# Patient Record
Sex: Female | Born: 1985 | Race: White | Hispanic: No | Marital: Single | Smoking: Never smoker
Health system: Southern US, Community
[De-identification: ages and names within clinical notes are randomized; demographics above are authoritative.]

## PROBLEM LIST (undated history)

## (undated) DIAGNOSIS — F419 Anxiety disorder, unspecified: Secondary | ICD-10-CM

## (undated) DIAGNOSIS — E66811 Obesity, class 1: Secondary | ICD-10-CM

## (undated) DIAGNOSIS — E669 Obesity, unspecified: Secondary | ICD-10-CM

## (undated) DIAGNOSIS — O24419 Gestational diabetes mellitus in pregnancy, unspecified control: Secondary | ICD-10-CM

## (undated) DIAGNOSIS — N12 Tubulo-interstitial nephritis, not specified as acute or chronic: Secondary | ICD-10-CM

## (undated) DIAGNOSIS — I10 Essential (primary) hypertension: Secondary | ICD-10-CM

## (undated) HISTORY — DX: Gestational diabetes mellitus in pregnancy, unspecified control: O24.419

## (undated) HISTORY — DX: Obesity, unspecified: E66.9

## (undated) HISTORY — DX: Obesity, class 1: E66.811

## (undated) HISTORY — DX: Tubulo-interstitial nephritis, not specified as acute or chronic: N12

---

## 2004-03-31 ENCOUNTER — Ambulatory Visit: Payer: Self-pay

## 2004-06-16 ENCOUNTER — Observation Stay: Payer: Self-pay | Admitting: Unknown Physician Specialty

## 2004-06-16 ENCOUNTER — Observation Stay: Payer: Self-pay

## 2004-06-17 ENCOUNTER — Inpatient Hospital Stay: Payer: Self-pay

## 2005-03-12 ENCOUNTER — Emergency Department: Payer: Self-pay | Admitting: Unknown Physician Specialty

## 2005-06-22 ENCOUNTER — Emergency Department: Payer: Self-pay | Admitting: Emergency Medicine

## 2005-12-22 ENCOUNTER — Emergency Department: Payer: Self-pay | Admitting: Emergency Medicine

## 2008-01-08 ENCOUNTER — Observation Stay: Payer: Self-pay

## 2008-01-08 ENCOUNTER — Other Ambulatory Visit: Payer: Self-pay

## 2008-01-10 ENCOUNTER — Ambulatory Visit: Payer: Self-pay | Admitting: Certified Nurse Midwife

## 2008-01-12 ENCOUNTER — Inpatient Hospital Stay: Payer: Self-pay

## 2008-08-28 ENCOUNTER — Emergency Department: Payer: Self-pay | Admitting: Emergency Medicine

## 2010-02-20 ENCOUNTER — Emergency Department: Payer: Self-pay | Admitting: Internal Medicine

## 2010-12-22 ENCOUNTER — Emergency Department: Payer: Self-pay | Admitting: Unknown Physician Specialty

## 2010-12-24 ENCOUNTER — Emergency Department: Payer: Self-pay | Admitting: Emergency Medicine

## 2011-01-07 ENCOUNTER — Emergency Department: Payer: Self-pay | Admitting: Unknown Physician Specialty

## 2015-11-13 ENCOUNTER — Emergency Department
Admission: EM | Admit: 2015-11-13 | Discharge: 2015-11-13 | Disposition: A | Discharge status: Home / Self Care | Payer: 59 | Attending: Emergency Medicine | Admitting: Emergency Medicine

## 2015-11-13 ENCOUNTER — Encounter: Payer: Self-pay | Admitting: *Deleted

## 2015-11-13 ENCOUNTER — Emergency Department: Payer: 59

## 2015-11-13 DIAGNOSIS — M545 Low back pain, unspecified: Secondary | ICD-10-CM

## 2015-11-13 DIAGNOSIS — I10 Essential (primary) hypertension: Secondary | ICD-10-CM | POA: Diagnosis not present

## 2015-11-13 HISTORY — DX: Essential (primary) hypertension: I10

## 2015-11-13 LAB — URINALYSIS COMPLETE WITH MICROSCOPIC (ARMC ONLY)
BILIRUBIN URINE: NEGATIVE
GLUCOSE, UA: NEGATIVE mg/dL
HGB URINE DIPSTICK: NEGATIVE
Ketones, ur: NEGATIVE mg/dL
Nitrite: NEGATIVE
Protein, ur: NEGATIVE mg/dL
Specific Gravity, Urine: 1.018 (ref 1.005–1.030)
pH: 7 (ref 5.0–8.0)

## 2015-11-13 LAB — POCT PREGNANCY, URINE: Preg Test, Ur: NEGATIVE

## 2015-11-13 MED ORDER — METHOCARBAMOL 500 MG PO TABS: 500.0000 mg | ORAL_TABLET | Freq: Four times a day (QID) | ORAL | Status: DC

## 2015-11-13 MED ORDER — HYDROCODONE-ACETAMINOPHEN 5-325 MG PO TABS: 1.0000 | ORAL_TABLET | ORAL | Status: DC | PRN

## 2015-11-13 NOTE — ED Notes (Signed)
Patient c/o low, left-side back pain that began two days ago and has worsened. Patient states she couldn't stand upright after getting out of bed today.

## 2015-11-13 NOTE — ED Provider Notes (Signed)
[facility] Emergency Department Provider Note   ____________________________________________  Time seen: Approximately 12:44 PM  I have reviewed the triage vital signs and the nursing notes.   HISTORY  Chief Complaint Back Pain    HPI Lindsey Gutierrez is a 30 y.o. female is here complaint of left sided low back pain that began 2 days ago and has become worse gradually over the course of today. Patient states she could not stand up right after getting out of bed today. She has taken some ibuprofen without any relief. She denies any urinary symptoms or history of kidney stones. She denies any injury to her back. She states she's had similar symptoms in the past and has never had her back x-rayed. She denies any paresthesias, or loss of bowel or bladder control.She rates her pain as a 6 out of 10.   Past Medical History  Diagnosis Date  . Hypertension     There are no active problems to display for this patient.   History reviewed. No pertinent past surgical history.  Current Outpatient Rx  Name  Route  Sig  Dispense  Refill  . HYDROcodone-acetaminophen (NORCO/VICODIN) 5-325 MG tablet   Oral   Take 1 tablet by mouth every 4 (four) hours as needed for moderate pain.   20 tablet   0   . methocarbamol (ROBAXIN) 500 MG tablet   Oral   Take 1 tablet (500 mg total) by mouth 4 (four) times daily.   20 tablet   0     Allergies Aspirin  No family history on file.  Social History Social History  Substance Use Topics  . Smoking status: Never Smoker   . Smokeless tobacco: None  . Alcohol Use: No    Review of Systems Constitutional: No fever/chills Cardiovascular: Denies chest pain. Respiratory: Denies shortness of breath. Gastrointestinal: No abdominal pain.  No nausea, no vomiting.  No diarrhea.   Genitourinary: Negative for dysuria. Musculoskeletal: Positive for low back pain. Skin: Negative for rash. Neurological: Negative for  headaches, focal weakness or numbness.  10-point ROS otherwise negative.  ____________________________________________   PHYSICAL EXAM:  VITAL SIGNS: ED Triage Vitals  Enc Vitals Group     BP 11/13/15 1236 127/79 mmHg     Pulse Rate 11/13/15 1236 80     Resp 11/13/15 1236 18     Temp 11/13/15 1236 98.3 F (36.8 C)     Temp Source 11/13/15 1236 Oral     SpO2 11/13/15 1236 96 %     Weight 11/13/15 1236 165 lb (74.844 kg)     Height 11/13/15 1236 5\' 4"  (1.626 m)     Head Cir --      Peak Flow --      Pain Score 11/13/15 1238 6     Pain Loc --      Pain Edu? --      Excl. in GC? --     Constitutional: Alert and oriented. Well appearing and in no acute distress. Eyes: Conjunctivae are normal. PERRL. EOMI. Head: Atraumatic. Nose: No congestion/rhinnorhea. Neck: No stridor.   Cardiovascular: Normal rate, regular rhythm. Grossly normal heart sounds.  Good peripheral circulation. Respiratory: Normal respiratory effort.  No retractions. Lungs CTAB. Gastrointestinal: Soft and nontender. No distention. No abdominal bruits. No CVA tenderness. Musculoskeletal: On examination of the back there is no gross deformity. There is moderate tenderness on palpation of the left paravertebral muscle area at L5-S1 and sacral area. There is no active muscle  spasm seen but patient's range of motion is restricted secondary to discomfort. Straight leg raises were 90 with discomfort on the left. Neurologic:  Normal speech and language. No gross focal neurologic deficits are appreciated. Reflexes are 2+ bilaterally. Skin:  Skin is warm, dry and intact. No rash noted. Psychiatric: Mood and affect are normal. Speech and behavior are normal.  ____________________________________________   LABS (all labs ordered are listed, but only abnormal results are displayed)  Labs Reviewed  URINALYSIS COMPLETEWITH MICROSCOPIC ([facility] ONLY) - Abnormal; Notable for the following:    Color, Urine YELLOW (*)     APPearance CLOUDY (*)    Leukocytes, UA 3+ (*)    Bacteria, UA RARE (*)    Squamous Epithelial / LPF 6-30 (*)    All other components within normal limits  POC URINE PREG, ED  POCT PREGNANCY, URINE     RADIOLOGY X-ray lumbar spine per radiologist is normal.  ____________________________________________   PROCEDURES  Procedure(s) performed: None  Critical Care performed: No  ____________________________________________   INITIAL IMPRESSION / ASSESSMENT AND PLAN / ED COURSE  Pertinent labs & imaging results that were available during my care of the patient were reviewed by me and considered in my medical decision making (see chart for details).  Discussed probable muscle strain with patient. Patient was given a prescription for Norco and Robaxin to be taken at home. She is also use ice or heat to her back and follow-up with her doctor as [facility] if any continued problems. ____________________________________________   FINAL CLINICAL IMPRESSION(S) / ED DIAGNOSES  Final diagnoses:  Left-sided low back pain without sciatica      NEW MEDICATIONS STARTED DURING THIS VISIT:  New Prescriptions   HYDROCODONE-ACETAMINOPHEN (NORCO/VICODIN) 5-325 MG TABLET    Take 1 tablet by mouth every 4 (four) hours as needed for moderate pain.   METHOCARBAMOL (ROBAXIN) 500 MG TABLET    Take 1 tablet (500 mg total) by mouth 4 (four) times daily.     Note:  This document was prepared using Dragon voice recognition software and may include unintentional dictation errors.    Tommi Rumps, PA-C 11/13/15 1452  Arnaldo Natal, MD 11/13/15 [phone]

## 2015-11-13 NOTE — Discharge Instructions (Signed)
Follow-up with your doctor next week if any continued problems. Use ice or heat to your back for comfort. Begin taking medication as directed. The aware that both medications could cause stress tenderness and do not drive while taking them.

## 2015-11-13 NOTE — ED Notes (Signed)
Pt presents with lower back pain since Wednesday. Denies injury or trauma. States that the pain was so bad this morning she was unable to walk to the bathroom and she had to crawl. NAD noted.

## 2015-11-13 NOTE — ED Notes (Signed)
Pt discharged home after verbalizing understanding of discharge instructions; nad noted. 

## 2017-06-22 ENCOUNTER — Emergency Department
Admission: EM | Admit: 2017-06-22 | Discharge: 2017-06-22 | Disposition: A | Discharge status: Home / Self Care | Payer: 59 | Attending: Emergency Medicine | Admitting: Emergency Medicine

## 2017-06-22 ENCOUNTER — Encounter: Payer: Self-pay | Admitting: Emergency Medicine

## 2017-06-22 ENCOUNTER — Other Ambulatory Visit: Payer: Self-pay

## 2017-06-22 DIAGNOSIS — Z79899 Other long term (current) drug therapy: Secondary | ICD-10-CM | POA: Insufficient documentation

## 2017-06-22 DIAGNOSIS — I10 Essential (primary) hypertension: Secondary | ICD-10-CM | POA: Insufficient documentation

## 2017-06-22 DIAGNOSIS — N3001 Acute cystitis with hematuria: Secondary | ICD-10-CM | POA: Insufficient documentation

## 2017-06-22 DIAGNOSIS — N3000 Acute cystitis without hematuria: Secondary | ICD-10-CM

## 2017-06-22 LAB — URINALYSIS, COMPLETE (UACMP) WITH MICROSCOPIC
Bacteria, UA: NONE SEEN
Bilirubin Urine: NEGATIVE
Glucose, UA: NEGATIVE mg/dL
Hgb urine dipstick: NEGATIVE
KETONES UR: 5 mg/dL — AB
Nitrite: NEGATIVE
PROTEIN: 100 mg/dL — AB
Specific Gravity, Urine: 1.03 (ref 1.005–1.030)
pH: 7 (ref 5.0–8.0)

## 2017-06-22 LAB — POCT PREGNANCY, URINE: Preg Test, Ur: NEGATIVE

## 2017-06-22 MED ORDER — NAPROXEN 500 MG PO TABS: 500.0000 mg | ORAL_TABLET | Freq: Two times a day (BID) | ORAL | Status: DC

## 2017-06-22 MED ORDER — TRAMADOL HCL 50 MG PO TABS: 50.0000 mg | ORAL_TABLET | Freq: Four times a day (QID) | ORAL | 0 refills | Status: DC | PRN

## 2017-06-22 MED ORDER — SULFAMETHOXAZOLE-TRIMETHOPRIM 800-160 MG PO TABS: 1.0000 | ORAL_TABLET | Freq: Two times a day (BID) | ORAL | 0 refills | Status: DC

## 2017-06-22 MED ORDER — PHENAZOPYRIDINE HCL 200 MG PO TABS: 200.0000 mg | ORAL_TABLET | Freq: Three times a day (TID) | ORAL | 0 refills | Status: DC | PRN

## 2017-06-22 NOTE — ED Provider Notes (Signed)
[facility] Emergency Department Provider Note   ____________________________________________   First MD Initiated Contact with Patient 06/22/17 1425     (approximate)  I have reviewed the triage vital signs and the nursing notes.   HISTORY  Chief Complaint Back Pain    HPI Lindsey Gutierrez is a 31 y.o. female patient presents with low back pain which started yesterday. Patient also complaining of urinary frequency and urgency. Patient denies fever, dysuria, or vaginal discharge.   Past Medical History:  Diagnosis Date  . Hypertension     There are no active problems to display for this patient.   History reviewed. No pertinent surgical history.  Prior to Admission medications   Medication Sig Start Date End Date Taking? Authorizing Provider  HYDROcodone-acetaminophen (NORCO/VICODIN) 5-325 MG tablet Take 1 tablet by mouth every 4 (four) hours as needed for moderate pain. 11/13/15   Tommi RumpsSummers, Rhonda L, PA-C  methocarbamol (ROBAXIN) 500 MG tablet Take 1 tablet (500 mg total) by mouth 4 (four) times daily. 11/13/15   Tommi RumpsSummers, Rhonda L, PA-C  naproxen (NAPROSYN) 500 MG tablet Take 1 tablet (500 mg total) by mouth 2 (two) times daily with a meal. 06/22/17   Joni ReiningSmith, Ronald K, PA-C  phenazopyridine (PYRIDIUM) 200 MG tablet Take 1 tablet (200 mg total) by mouth 3 (three) times daily as needed for pain. 06/22/17   Joni ReiningSmith, Ronald K, PA-C  sulfamethoxazole-trimethoprim (BACTRIM DS,SEPTRA DS) 800-160 MG tablet Take 1 tablet by mouth 2 (two) times daily. 06/22/17   Joni ReiningSmith, Ronald K, PA-C  traMADol (ULTRAM) 50 MG tablet Take 1 tablet (50 mg total) by mouth every 6 (six) hours as needed for moderate pain. 06/22/17   Joni ReiningSmith, Ronald K, PA-C    Allergies Aspirin  No family history on file.  Social History Social History   Tobacco Use  . Smoking status: Never Smoker  . Smokeless tobacco: Never Used  Substance Use Topics  . Alcohol use: No  . Drug use: Not on file     Review of Systems Constitutional: No fever/chills Eyes: No visual changes. ENT: No sore throat. Cardiovascular: Denies chest pain. Respiratory: Denies shortness of breath. Gastrointestinal: No abdominal pain.  No nausea, no vomiting.  No diarrhea.  No constipation. Genitourinary: Negative for dysuria. Musculoskeletal: Left flank pain  Skin: Negative for rash. Neurological: Negative for headaches, focal weakness or numbness. Allergic/Immunilogical: Aspirin ____________________________________________   PHYSICAL EXAM:  VITAL SIGNS: ED Triage Vitals  Enc Vitals Group     BP 06/22/17 1418 (!) 142/77     Pulse Rate 06/22/17 1418 64     Resp 06/22/17 1418 18     Temp 06/22/17 1418 97.9 F (36.6 C)     Temp Source 06/22/17 1418 Oral     SpO2 06/22/17 1418 99 %     Weight 06/22/17 1419 175 lb (79.4 kg)     Height 06/22/17 1419 5\' 5"  (1.651 m)     Head Circumference --      Peak Flow --      Pain Score --      Pain Loc --      Pain Edu? --      Excl. in GC? --     Constitutional: Alert and oriented. Well appearing and in no acute distress. Cardiovascular: Normal rate, regular rhythm. Grossly normal heart sounds.  Good peripheral circulation. Respiratory: Normal respiratory effort.  No retractions. Lungs CTAB. Gastrointestinal: Soft and nontender. No distention. No abdominal bruits. No CVA tenderness. Neurologic:  Normal  speech and language. No gross focal neurologic deficits are appreciated. No gait instability. Skin:  Skin is warm, dry and intact. No rash noted. Psychiatric: Mood and affect are normal. Speech and behavior are normal.  ____________________________________________   LABS (all labs ordered are listed, but only abnormal results are displayed)  Labs Reviewed  URINALYSIS, COMPLETE (UACMP) WITH MICROSCOPIC - Abnormal; Notable for the following components:      Result Value   Color, Urine YELLOW (*)    APPearance CLOUDY (*)    Ketones, ur 5 (*)     Protein, ur 100 (*)    Leukocytes, UA SMALL (*)    Squamous Epithelial / LPF 6-30 (*)    All other components within normal limits  POC URINE PREG, ED  POCT PREGNANCY, URINE   ____________________________________________  EKG   ____________________________________________  RADIOLOGY  No results found.  ____________________________________________   PROCEDURES  Procedure(s) performed: None  Procedures  Critical Care performed: No  ____________________________________________   INITIAL IMPRESSION / ASSESSMENT AND PLAN / ED COURSE  As part of my medical decision making, I reviewed the following data within the electronic MEDICAL RECORD NUMBER    Left flank pain secondary to urinary tract infection. Scars level results with patient. Patient given discharge Instructions advised take medication as directed. [facility]    ____________________________________________   FINAL CLINICAL IMPRESSION(S) / ED DIAGNOSES  Final diagnoses:  Acute cystitis without hematuria     ED Discharge Orders        Ordered    sulfamethoxazole-trimethoprim (BACTRIM DS,SEPTRA DS) 800-160 MG tablet  2 times daily     06/22/17 1526    naproxen (NAPROSYN) 500 MG tablet  2 times daily with meals     06/22/17 1526    traMADol (ULTRAM) 50 MG tablet  Every 6 hours PRN     06/22/17 1526    phenazopyridine (PYRIDIUM) 200 MG tablet  3 times daily PRN     06/22/17 1526       Note:  This document was prepared using Dragon voice recognition software and may include unintentional dictation errors.    Joni Reining, PA-C 06/22/17 1535    Jene Every, MD 06/22/17 [phone]

## 2017-06-22 NOTE — ED Triage Notes (Addendum)
Presents with  lower back pain since yesterday    Ambulates well to treatment room  Denies any injury but has some urinary freq

## 2017-08-22 ENCOUNTER — Encounter: Payer: Self-pay | Admitting: Obstetrics & Gynecology

## 2017-08-29 ENCOUNTER — Encounter: Payer: Self-pay | Admitting: Obstetrics & Gynecology

## 2017-08-29 ENCOUNTER — Ambulatory Visit (INDEPENDENT_AMBULATORY_CARE_PROVIDER_SITE_OTHER): Payer: BLUE CROSS/BLUE SHIELD | Admitting: Obstetrics & Gynecology

## 2017-08-29 VITALS — BP 110/70 | HR 73 | Ht 65.0 in | Wt 193.0 lb

## 2017-08-29 DIAGNOSIS — N920 Excessive and frequent menstruation with regular cycle: Secondary | ICD-10-CM | POA: Diagnosis not present

## 2017-08-29 NOTE — Progress Notes (Signed)
HPI:      [name] is a 32 y.o. [id number] who LMP was Patient's last menstrual period was 07/31/2017., presents today for a problem visit.  She complains of menorrhagia that  began about a month ago and its severity is described as moderate.  She has regular periods every 28 days and they are associated with no menstrual cramping.  She has used the following for attempts at control: maxi pad.  This past period, however,  Was 14 days prolonged.  She has not been on [facility] since stopping Depo last year (after 10 years of use)  No periods on Depo.  Has HTN and has not been on OCPs in past.  Well controlled on atenolol.  Previous evaluation: none. Prior Diagnosis: dysfunctional uterine bleeding. Previous Treatment: none.  She is single partner, contraception - none.  Contraception: none. Hx of STDs: none. She is premenopausal.  PMHx: She  has a past medical history of Hypertension. Also,  has no past surgical history on file., family history includes Hypertension in her maternal grandfather and maternal grandmother; Stroke in her maternal grandmother.,  reports that  has never smoked. she has never used smokeless tobacco. She reports that she does not drink alcohol or use drugs.  She  Current Outpatient Medications:  .  atenolol (TENORMIN) 50 MG tablet, , Disp: , Rfl:  .  HYDROcodone-acetaminophen (NORCO/VICODIN) 5-325 MG tablet, Take 1 tablet by mouth every 4 (four) hours as needed for moderate pain. (Patient not taking: Reported on 08/29/2017), Disp: 20 tablet, Rfl: 0 .  methocarbamol (ROBAXIN) 500 MG tablet, Take 1 tablet (500 mg total) by mouth 4 (four) times daily. (Patient not taking: Reported on 08/29/2017), Disp: 20 tablet, Rfl: 0 .  naproxen (NAPROSYN) 500 MG tablet, Take 1 tablet (500 mg total) by mouth 2 (two) times daily with a meal. (Patient not taking: Reported on 08/29/2017), Disp: 20 tablet, Rfl: 00 .  phenazopyridine (PYRIDIUM) 200 MG tablet, Take 1 tablet (200 mg total) by mouth 3  (three) times daily as needed for pain. (Patient not taking: Reported on 08/29/2017), Disp: 6 tablet, Rfl: 0 .  sulfamethoxazole-trimethoprim (BACTRIM DS,SEPTRA DS) 800-160 MG tablet, Take 1 tablet by mouth 2 (two) times daily. (Patient not taking: Reported on 08/29/2017), Disp: 20 tablet, Rfl: 0 .  traMADol (ULTRAM) 50 MG tablet, Take 1 tablet (50 mg total) by mouth every 6 (six) hours as needed for moderate pain. (Patient not taking: Reported on 08/29/2017), Disp: 12 tablet, Rfl: 0  Also, is allergic to aspirin.  Review of Systems  Constitutional: Negative for chills, fever and malaise/fatigue.  HENT: Negative for congestion, sinus pain and sore throat.   Eyes: Negative for blurred vision and pain.  Respiratory: Negative for cough and wheezing.   Cardiovascular: Negative for chest pain and leg swelling.  Gastrointestinal: Negative for abdominal pain, constipation, diarrhea, heartburn, nausea and vomiting.  Genitourinary: Negative for dysuria, frequency, hematuria and urgency.  Musculoskeletal: Negative for back pain, joint pain, myalgias and neck pain.  Skin: Negative for itching and rash.  Neurological: Negative for dizziness, tremors and weakness.  Endo/Heme/Allergies: Does not bruise/bleed easily.  Psychiatric/Behavioral: Negative for depression. The patient is not nervous/anxious and does not have insomnia.     Objective: BP 110/70   Pulse 73   Ht 5\' 5"  (1.651 m)   Wt 193 lb (87.5 kg)   LMP 07/31/2017   BMI 32.12 kg/m  Physical Exam  Constitutional: She is oriented to person, place, and time. She appears  well-developed and well-nourished. No distress.  Musculoskeletal: Normal range of motion.  Neurological: She is alert and oriented to person, place, and time.  Skin: Skin is warm and dry.  Psychiatric: She has a normal mood and affect.  Vitals reviewed.   ASSESSMENT/PLAN:  dysfunctional uterine bleeding  Too early to tell if pattern or not. Monitor for now. Consider OCP (well  controlled HTN) vs IUD (prefers not, used in past, did not like string) US if persists to eval for physical etiology  Treatment option for menorrhagia or menometrorrhagia discussed in great detail with the patient.  Options include hormonal therapy, IUD therapy such as Mirena, D&C, Ablation, and Hysterectomy.  The pros and cons of each option discussed with patient.  Problem List Items Addressed This Visit      Other   Menorrhagia with regular cycle - Primary     A total of 25 minutes were spent face-to-face with the patient during this encounter and over half of that time dealt with counseling and coordination of care.  Annamarie MajorPaul Harris, MD, Merlinda FrederickFACOG [facility], [facility] 08/29/2017  2:11 PM

## 2017-11-06 ENCOUNTER — Telehealth: Payer: Self-pay

## 2017-11-06 ENCOUNTER — Other Ambulatory Visit: Payer: Self-pay | Admitting: Obstetrics & Gynecology

## 2017-11-06 DIAGNOSIS — N938 Other specified abnormal uterine and vaginal bleeding: Secondary | ICD-10-CM

## 2017-11-06 NOTE — Telephone Encounter (Signed)
Pt came in ~2 mos ago for DUB. She started menses 10/14/17 and is still bleeding. Pt inquiring if birth control is her only option to get back regular. Doesn't really want birth control of any kind. Inquiring if anything will regulate other than birth control.

## 2017-11-06 NOTE — Telephone Encounter (Signed)
Pt requesting cb. No specifics given. [phone]

## 2017-11-06 NOTE — Telephone Encounter (Signed)
Notified pt of [facility] response & advised our front desk staff would contact her tomorrow to schedule the apts.

## 2017-11-06 NOTE — Telephone Encounter (Signed)
Schedule for [place] first and then I can discuss options with her afterwards GYN [place] w PH f/u

## 2017-11-07 NOTE — Telephone Encounter (Signed)
Spoke with patient about scheduling u/s and follow up with Dr. Tiburcio Pea. Patient requested for an Friday early morning appt due to work. Patient is schedule 11/24/17 at 8:30am

## 2017-11-24 ENCOUNTER — Ambulatory Visit: Payer: BLUE CROSS/BLUE SHIELD | Admitting: Obstetrics & Gynecology

## 2017-11-24 ENCOUNTER — Other Ambulatory Visit: Payer: BLUE CROSS/BLUE SHIELD

## 2018-01-09 ENCOUNTER — Ambulatory Visit: Payer: BLUE CROSS/BLUE SHIELD | Admitting: Obstetrics & Gynecology

## 2018-01-23 ENCOUNTER — Ambulatory Visit: Payer: BLUE CROSS/BLUE SHIELD | Admitting: Obstetrics & Gynecology

## 2018-09-07 ENCOUNTER — Telehealth: Payer: Self-pay

## 2018-09-07 NOTE — Telephone Encounter (Signed)
Pt requests a call back.  [phone] [facility]

## 2018-09-07 NOTE — Telephone Encounter (Signed)
Pt called back at 5pm stating she has been exposed to an STD but is unable to miss work to be seen.  Wanted rx sent in.  Adv needed to be ck'd to be sure she had STD before rx sent.  Adv to go to an Urgent Care if unable to come in to see [place].

## 2018-09-07 NOTE — Telephone Encounter (Signed)
Patient is returning missed call. Please advise 

## 2019-06-16 NOTE — Progress Notes (Signed)
Gynecology Annual Exam  PCP: Leanna Sato, MD  Chief Complaint:  Chief Complaint  Patient presents with  . Gynecologic Exam    wants everything ck'd;     History of Present Illness: Lindsey Gutierrez is a 33 y.o. [id number] WF who presents for a routine NP annual exam. The patient has no complaints today.  Her menses are irregular, they occur every 4-6 weeks, and they last 4-5 days. Her flow is moderate. She does not have intermenstrual bleeding. Her last menstrual period was November 2020. She reports dysmenorrhea and occasionally takes Tylenol with relief. Last pap smear: 2018, results were negative per patient report [facility]). Hx of abnormal Pap smears-requiring colposcopy/ biopsy   The patient is  sexually active. She currently uses nothing for contraception. She does not have dyspareunia.  Her past medical history is remarkable for hypertension and obesity  The patient does perform self breast exams. Her last mammogram was NA.  There is no family history of breast cancer.   There is no family history of ovarian cancer.   The patient denies smoking.  She denies drinking.alcohol.  She denies illegal drug use.  The patient has not been exercising since Covid 19  The patient denies current symptoms of depression.    She does get adequate calcium in her diet.  Review of Systems: Review of Systems  Constitutional: Negative for chills, fever and weight loss.  HENT: Negative for congestion, sinus pain and sore throat.   Eyes: Negative for blurred vision and pain.  Respiratory: Negative for hemoptysis, shortness of breath and wheezing.   Cardiovascular: Negative for chest pain, palpitations and leg swelling.  Gastrointestinal: Negative for abdominal pain, blood in stool, diarrhea, heartburn, nausea and vomiting.  Genitourinary: Negative for dysuria, frequency, hematuria and urgency.       Positive for irregular menses  Musculoskeletal: Negative for back pain,  joint pain and myalgias.  Skin: Negative for itching and rash.  Neurological: Negative for dizziness, tingling and headaches.  Endo/Heme/Allergies: Negative for environmental allergies and polydipsia. Does not bruise/bleed easily.       Negative for hirsutism   Psychiatric/Behavioral: Negative for depression. The patient is not nervous/anxious and does not have insomnia.     Past Medical History:  Past Medical History:  Diagnosis Date  . Hypertension   . Obesity (BMI 30.0-34.9)   . Pyelonephritis     Past Surgical History:  No past surgical history on file.  Family History:  Family History  Problem Relation Age of Onset  . Stroke Maternal Grandmother   . Hypertension Maternal Grandmother   . Hypertension Maternal Grandfather   . Colon cancer Maternal Grandfather     Social History:  Social History   Socioeconomic History  . Marital status: Single    Spouse name: Not on file  . Number of children: 2  . Years of education: Not on file  . Highest education level: Not on file  Occupational History  . Occupation: Art therapist  Tobacco Use  . Smoking status: Never Smoker  . Smokeless tobacco: Never Used  Substance and Sexual Activity  . Alcohol use: No  . Drug use: No  . Sexual activity: Yes    Birth control/protection: None  Other Topics Concern  . Not on file  Social History Narrative  . Not on file   Social Determinants of Health   Financial Resource Strain:   . Difficulty of Paying Living Expenses: Not on file  Food Insecurity:   .  Worried About Charity fundraiser in the Last Year: Not on file  . Ran Out of Food in the Last Year: Not on file  Transportation Needs:   . Lack of Transportation (Medical): Not on file  . Lack of Transportation (Non-Medical): Not on file  Physical Activity:   . Days of Exercise per Week: Not on file  . Minutes of Exercise per Session: Not on file  Stress:   . Feeling of Stress : Not on file  Social Connections:   .  Frequency of Communication with Friends and Family: Not on file  . Frequency of Social Gatherings with Friends and Family: Not on file  . Attends Religious Services: Not on file  . Active Member of Clubs or Organizations: Not on file  . Attends Archivist Meetings: Not on file  . Marital Status: Not on file  Intimate Partner Violence:   . Fear of Current or Ex-Partner: Not on file  . Emotionally Abused: Not on file  . Physically Abused: Not on file  . Sexually Abused: Not on file    Allergies:  Allergies  Allergen Reactions  . Aspirin     Heart skips    Medications:  Current Outpatient Medications:  .  atenolol (TENORMIN) 50 MG tablet, , Disp: , Rfl:    Physical Exam Vitals: BP 120/80   Pulse 78   Ht 5' 4.5" (1.638 m)   Wt 198 lb (89.8 kg)   LMP 05/10/2019 (Approximate) Comment: on depo  BMI 33.46 kg/m   General: WF in NAD HEENT: normocephalic, anicteric Neck: no thyroid enlargement, no palpable nodules, no cervical lymphadenopathy  Pulmonary: No increased work of breathing, CTAB Cardiovascular: RRR, without murmur  Breast: Breast symmetrical, no tenderness, no palpable nodules or masses, no skin or nipple retraction present, no nipple discharge.  No axillary, infraclavicular or supraclavicular lymphadenopathy. Abdomen: Soft, non-tender, non-distended.  Umbilicus without lesions.  No hepatomegaly or masses palpable. No evidence of hernia. Genitourinary:  External: Normal external female genitalia.  Normal urethral meatus, normal Bartholin's and Skene's glands.    Vagina: Normal vaginal mucosa, no evidence of prolapse.    Cervix: Grossly normal in appearance, no bleeding, non-tender  Uterus: normal size, shape, and consistency, mobile, and non-tender  Adnexa: No adnexal masses, non-tender  Rectal: deferred  Lymphatic: no evidence of inguinal lymphadenopathy Extremities: no edema, erythema, or tenderness Neurologic: Grossly intact Psychiatric: mood  appropriate, affect full  Urine pregnancy test: negative   Assessment: 33 y.o. [id number] normal gynecological exam Irregular menses  Plan:    1) Breast cancer screening - recommend monthly self breast exam.   2) STI screening was offered and accepted. RPR, HIV, GC/Chlamydia  3) Cervical cancer screening - Pap was done. ASCCP guidelines and rational discussed.  Patient opts for yearly screening interval  4) Contraception - declined discussion of contraceptive options. Encouraged taking folic acid 509 mcg daily in case of unplanned pregnancy.  5) Routine healthcare maintenance including cholesterol and diabetes screening ordered today   6) RTO in 1 year and prn  Dalia Heading, CNM

## 2019-06-17 ENCOUNTER — Encounter: Payer: Self-pay | Admitting: Certified Nurse Midwife

## 2019-06-17 ENCOUNTER — Other Ambulatory Visit (HOSPITAL_COMMUNITY)
Admission: RE | Admit: 2019-06-17 | Discharge: 2019-06-17 | Disposition: A | Discharge status: Home / Self Care | Payer: BC Managed Care – PPO | Source: Ambulatory Visit | Attending: Certified Nurse Midwife | Admitting: Certified Nurse Midwife

## 2019-06-17 ENCOUNTER — Other Ambulatory Visit: Payer: Self-pay

## 2019-06-17 ENCOUNTER — Ambulatory Visit (INDEPENDENT_AMBULATORY_CARE_PROVIDER_SITE_OTHER): Payer: BC Managed Care – PPO | Admitting: Certified Nurse Midwife

## 2019-06-17 VITALS — BP 120/80 | HR 78 | Ht 64.5 in | Wt 198.0 lb

## 2019-06-17 DIAGNOSIS — Z01419 Encounter for gynecological examination (general) (routine) without abnormal findings: Secondary | ICD-10-CM

## 2019-06-17 DIAGNOSIS — Z124 Encounter for screening for malignant neoplasm of cervix: Secondary | ICD-10-CM | POA: Diagnosis present

## 2019-06-17 DIAGNOSIS — Z3202 Encounter for pregnancy test, result negative: Secondary | ICD-10-CM

## 2019-06-17 DIAGNOSIS — Z131 Encounter for screening for diabetes mellitus: Secondary | ICD-10-CM

## 2019-06-17 DIAGNOSIS — Z13220 Encounter for screening for lipoid disorders: Secondary | ICD-10-CM

## 2019-06-17 DIAGNOSIS — N926 Irregular menstruation, unspecified: Secondary | ICD-10-CM

## 2019-06-17 DIAGNOSIS — Z113 Encounter for screening for infections with a predominantly sexual mode of transmission: Secondary | ICD-10-CM | POA: Insufficient documentation

## 2019-06-17 LAB — POCT URINE PREGNANCY: Preg Test, Ur: NEGATIVE

## 2019-06-18 LAB — LIPID PANEL WITH LDL/HDL RATIO
Cholesterol, Total: 213 mg/dL — ABNORMAL HIGH (ref 100–199)
HDL: 41 mg/dL (ref >39)
LDL Chol Calc (NIH): 145 mg/dL — ABNORMAL HIGH (ref 0–99)
LDL/HDL Ratio: 3.5 ratio — ABNORMAL HIGH (ref 0.0–3.2)
Triglycerides: 150 mg/dL — ABNORMAL HIGH (ref 0–149)
VLDL Cholesterol Cal: 27 mg/dL (ref 5–40)

## 2019-06-18 LAB — HIV ANTIBODY (ROUTINE TESTING W REFLEX): HIV Screen 4th Generation wRfx: NONREACTIVE

## 2019-06-18 LAB — HEMOGLOBIN A1C
Est. average glucose Bld gHb Est-mCnc: 114 mg/dL
Hgb A1c MFr Bld: 5.6 % (ref 4.8–5.6)

## 2019-06-18 LAB — RPR QUALITATIVE: RPR Ser Ql: NONREACTIVE

## 2019-06-19 LAB — CYTOLOGY - PAP
Chlamydia: NEGATIVE
Comment: NEGATIVE
Comment: NEGATIVE
Comment: NORMAL
Diagnosis: NEGATIVE
High risk HPV: NEGATIVE
Neisseria Gonorrhea: NEGATIVE

## 2019-06-23 ENCOUNTER — Encounter: Payer: Self-pay | Admitting: Certified Nurse Midwife

## 2019-06-28 NOTE — L&D Delivery Note (Signed)
Delivery Note  Lindsey Gutierrez is a [id number] at [redacted]w[redacted]d with an LMP of 08/08/2019, inconsistent with [place] at [redacted]w[redacted]d.  First Stage: Labor onset: 1606 with AROM  Induction: Cook catheter, oxytocin, AROM  Analgesia /Anesthesia intrapartum: Epidural AROM at 1606  Second Stage: Complete dilation at 2027 Onset of pushing at 2044 FHR second stage 155bpm with moderate variability, intermittent variables   Essence presented to L&D d/t nonreactive NST in office. Decision made to proceed with IOL d/t pregnancy complicated by chronic HTN, A2GDM, and AMA.  Cervical ripening was done with [facility] Catheter.  Oxytocin was started for induction and she progressed well after AROM.  She had a spontaneous urge to push at C/C/+1.  Fetal position was ROP.  Maternal positioning was utililized to help with rotation along with maternal pushing efforts.  She pushed effectively for 30 min for a spontaneous birth.  Delivery of a viable baby boy on 04/25/2020 at 2117 by CNM Delivery of fetal head in OA position with restitution to ROT. Loose nuchal cord x 1;  Anterior then posterior shoulders delivered easily with gentle downward traction, infant delivered through nuchal cord. Baby placed on mom's chest, and attended to by baby RN Cord double clamped after cessation of pulsation, cut by father of baby  Cord blood sample collected: O pos   Third Stage: Oxytocin bolus started after delivery of infant for hemorrhage prophylaxis  Placenta delivered intact with 3 VC @ 2127 Placenta disposition: discarded  Uterine tone firm / bleeding moderate  No laceration identified  Anesthesia for repair: N/A Repair N/A Est. Blood Loss (mL):   Complications: None  Mom to postpartum.  Baby to Couplet care / Skin to Skin.  Newborn: Information for the patient's newborn:  Lindsey Gutierrez, Lindsey Gutierrez [[id number]]  Live born female "Dalleon" Birth Weight:  Pending  APGAR: 8, 10  Newborn Delivery   Birth date/time: 04/25/2020  21:17:00 Delivery type: Vaginal, Spontaneous     Feeding planned: breast   ---------- Margaretmary Eddy, CNM Certified Nurse Midwife [facility]

## 2019-08-19 ENCOUNTER — Other Ambulatory Visit: Payer: Self-pay | Admitting: Orthopedic Surgery

## 2019-08-19 DIAGNOSIS — M2391 Unspecified internal derangement of right knee: Secondary | ICD-10-CM

## 2019-08-26 ENCOUNTER — Other Ambulatory Visit: Payer: Self-pay

## 2019-08-26 ENCOUNTER — Ambulatory Visit
Admission: RE | Admit: 2019-08-26 | Discharge: 2019-08-26 | Disposition: A | Discharge status: Home / Self Care | Payer: BC Managed Care – PPO | Source: Ambulatory Visit | Attending: Orthopedic Surgery | Admitting: Orthopedic Surgery

## 2019-08-26 DIAGNOSIS — M2391 Unspecified internal derangement of right knee: Secondary | ICD-10-CM

## 2019-09-26 DIAGNOSIS — O0993 Supervision of high risk pregnancy, unspecified, third trimester: Secondary | ICD-10-CM

## 2019-10-23 LAB — OB RESULTS CONSOLE VARICELLA ZOSTER ANTIBODY, IGG: Varicella: IMMUNE

## 2019-10-23 LAB — OB RESULTS CONSOLE HEPATITIS B SURFACE ANTIGEN: Hepatitis B Surface Ag: NEGATIVE

## 2019-10-23 LAB — OB RESULTS CONSOLE RUBELLA ANTIBODY, IGM: Rubella: IMMUNE

## 2019-11-06 DIAGNOSIS — I10 Essential (primary) hypertension: Secondary | ICD-10-CM | POA: Insufficient documentation

## 2019-12-16 ENCOUNTER — Other Ambulatory Visit: Payer: Self-pay

## 2019-12-16 ENCOUNTER — Emergency Department
Admission: EM | Admit: 2019-12-16 | Discharge: 2019-12-16 | Disposition: A | Discharge status: Home / Self Care | Payer: Medicaid Other | Attending: Emergency Medicine | Admitting: Emergency Medicine

## 2019-12-16 ENCOUNTER — Encounter: Payer: Self-pay | Admitting: Emergency Medicine

## 2019-12-16 DIAGNOSIS — Y93I9 Activity, other involving external motion: Secondary | ICD-10-CM | POA: Insufficient documentation

## 2019-12-16 DIAGNOSIS — Z79899 Other long term (current) drug therapy: Secondary | ICD-10-CM | POA: Diagnosis not present

## 2019-12-16 DIAGNOSIS — Y92410 Unspecified street and highway as the place of occurrence of the external cause: Secondary | ICD-10-CM | POA: Insufficient documentation

## 2019-12-16 DIAGNOSIS — Z3A18 18 weeks gestation of pregnancy: Secondary | ICD-10-CM | POA: Diagnosis not present

## 2019-12-16 DIAGNOSIS — I10 Essential (primary) hypertension: Secondary | ICD-10-CM | POA: Insufficient documentation

## 2019-12-16 DIAGNOSIS — Y999 Unspecified external cause status: Secondary | ICD-10-CM | POA: Insufficient documentation
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
## 2019-12-16 NOTE — ED Notes (Signed)
RN unable to locate FHT. Pt states she can fill the fetus moving.
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
## 2020-01-28 ENCOUNTER — Encounter: Payer: Medicaid Other | Admitting: *Deleted

## 2020-01-28 ENCOUNTER — Other Ambulatory Visit: Payer: Self-pay

## 2020-01-28 ENCOUNTER — Encounter: Payer: Self-pay | Admitting: *Deleted

## 2020-01-28 VITALS — BP 104/68 | Ht 64.0 in | Wt 206.2 lb

## 2020-01-28 DIAGNOSIS — O24410 Gestational diabetes mellitus in pregnancy, diet controlled: Secondary | ICD-10-CM | POA: Insufficient documentation

## 2020-01-28 DIAGNOSIS — Z3A00 Weeks of gestation of pregnancy not specified: Secondary | ICD-10-CM | POA: Insufficient documentation

## 2020-01-28 NOTE — Progress Notes (Signed)
Diabetes Self-Management Education  Visit Type: First/Initial  Appt. Start Time: 1025 Appt. End Time: 1150  01/28/2020  [name], identified by name and date of birth, is a 34 y.o. female with a diagnosis of Diabetes: Gestational Diabetes.   ASSESSMENT  Blood pressure 104/68, height 5\' 4"  (1.626 m), weight 206 lb 3.2 oz (93.5 kg), last menstrual period 08/08/2019, estimated date of delivery 05/14/2020 Body mass index is 35.39 kg/m.   Diabetes Self-Management Education - 01/28/20 1353      Visit Information   Visit Type First/Initial      Initial Visit   Diabetes Type Gestational Diabetes    Are you currently following a meal plan? No    Are you taking your medications as prescribed? Yes    Date Diagnosed 2 weeks ago      Health Coping   How would you rate your overall health? Good      Psychosocial Assessment   Patient Belief/Attitude about Diabetes Other (comment)   "a little worried"   Self-care barriers None    Self-management support Doctor's office    Patient Concerns Nutrition/Meal planning;Glycemic Control;Medication    Special Needs None    Preferred Learning Style Auditory;Visual    Learning Readiness Ready    How often do you need to have someone help you when you read instructions, pamphlets, or other written materials from your doctor or pharmacy? 1 - Never    What is the last grade level you completed in school? 11th      Pre-Education Assessment   Patient understands the diabetes disease and treatment process. Needs Instruction    Patient understands incorporating nutritional management into lifestyle. Needs Instruction    Patient undertands incorporating physical activity into lifestyle. Needs Instruction    Patient understands using medications safely. Needs Instruction    Patient understands monitoring blood glucose, interpreting and using results Needs Instruction    Patient understands prevention, detection, and treatment of acute  complications. Needs Instruction    Patient understands prevention, detection, and treatment of chronic complications. Needs Instruction    Patient understands how to develop strategies to address psychosocial issues. Needs Instruction    Patient understands how to develop strategies to promote health/change behavior. Needs Instruction      Complications   Last HgB A1C per patient/outside source 5.6 %   10/23/2019   How often do you check your blood sugar? 0 times/day (not testing)   Provided an Accu-Chek Guide Me meter and instructed on use. BG upon return demonstration was 96 mg/dL at 10/25/2019 am - 3 hrs pp.   Have you had a dilated eye exam in the past 12 months? Yes    Have you had a dental exam in the past 12 months? No    Are you checking your feet? Yes    How many days per week are you checking your feet? 7      Dietary Intake   Breakfast cereal and milk; fruit (grapes, watermelon)    Snack (morning) 2-3 snacks/day - grapes, ritz crackers and cheese; yogurt; gummies    Lunch 6 inch 17:40 sub with spinach, avocado, onions, tomatoes; salad with fried chicken, lettuce, berries, cheese, cuccumbers, tomatoes    Snack (afternoon) fruit or same as morning snack    Dinner beef, chicken, spaghetti, potatoes, peas, corn, rice, green beans, broccoli, carrots    Snack (evening) crackers or same as morning snack    Beverage(s) water, apple and orange juice, Sprite with supper  Exercise   Exercise Type ADL's      Patient Education   Previous Diabetes Education No    Disease state  Definition of diabetes, type 1 and 2, and the diagnosis of diabetes;Factors that contribute to the development of diabetes    Nutrition management  Role of diet in the treatment of diabetes and the relationship between the three main macronutrients and blood glucose level;Reviewed blood glucose goals for pre and post meals and how to evaluate the patients' food intake on their blood glucose level.    Physical  activity and exercise  Role of exercise on diabetes management, blood pressure control and cardiac health.    Medications Other (comment)   Limited use of oral medications during pregnancy and possibility of insulin therapy.   Monitoring Taught/evaluated SMBG meter.;Purpose and frequency of SMBG.;Taught/discussed recording of test results and interpretation of SMBG.;Identified appropriate SMBG and/or A1C goals.;Ketone testing, when, how.    Chronic complications Relationship between chronic complications and blood glucose control    Psychosocial adjustment Identified and addressed patients feelings and concerns about diabetes    Preconception care Pregnancy and GDM  Role of pre-pregnancy blood glucose control on the development of the fetus;Reviewed with patient blood glucose goals with pregnancy;Role of family planning for patients with diabetes      Individualized Goals (developed by patient)   Reducing Risk Other (comment)   improve blood sugars, decrease medications     Outcomes   Expected Outcomes Demonstrated interest in learning. Expect positive outcomes           Individualized Plan for Diabetes Self-Management Training:   Learning Objective:  Patient will have a greater understanding of diabetes self-management. Patient education plan is to attend individual and/or group sessions per assessed needs and concerns.   Plan:   Patient Instructions  Read booklet on Gestational Diabetes Follow Gestational Meal Planning Guidelines  Limit fried foods Avoid sugar sweetened drinks (juice, soda) Avoid cold cereal and fruit for breakfast Complete a 3 Day Food Record and bring to next appointment Check blood sugars 4 x day - before breakfast and 2 hrs after every meal and record  Bring blood sugar log to all appointments Call MD for prescription for meter strips and lancets Strips  Accu-Chek Guide Lancets   Accu-Chek Softclix Purchase urine ketone strips if instructed by MD and check  urine ketones every am:  If + increase bedtime snack to 1 protein and 2 carbohydrate servings Walk 20-30 minutes at least 5 x week if permitted by MD  Expected Outcomes:  Demonstrated interest in learning. Expect positive outcomes  Education material provided:  Gestational Booklet Gestational Meal Planning Guidelines Simple Meal Plan Viewed Gestational Diabetes Video Meter = Accu-Chek Guide Me 3 Day Food Record Goals for a Healthy Pregnancy  If problems or questions, patient to contact team via:  Sharion Settler, RN, CCM, CDCES [phone]  Future DSME appointment:  February 05, 2020 with the dietitian

## 2020-01-28 NOTE — Patient Instructions (Signed)
Read booklet on Gestational Diabetes Follow Gestational Meal Planning Guidelines  Limit fried foods Avoid sugar sweetened drinks (juice, soda) Avoid cold cereal and fruit for breakfast Complete a 3 Day Food Record and bring to next appointment Check blood sugars 4 x day - before breakfast and 2 hrs after every meal and record  Bring blood sugar log to all appointments Call MD for prescription for meter strips and lancets Strips  Accu-Chek Guide Lancets   Accu-Chek Softclix Purchase urine ketone strips if instructed by MD and check urine ketones every am:  If + increase bedtime snack to 1 protein and 2 carbohydrate servings Walk 20-30 minutes at least 5 x week if permitted by MD

## 2020-01-29 ENCOUNTER — Ambulatory Visit: Payer: Medicaid Other | Admitting: *Deleted

## 2020-02-05 ENCOUNTER — Encounter: Payer: Medicaid Other | Admitting: Dietician

## 2020-02-05 ENCOUNTER — Other Ambulatory Visit: Payer: Self-pay

## 2020-02-05 ENCOUNTER — Encounter: Payer: Self-pay | Admitting: Dietician

## 2020-02-05 VITALS — BP 108/64 | Ht 64.0 in | Wt 207.3 lb

## 2020-02-05 DIAGNOSIS — O24410 Gestational diabetes mellitus in pregnancy, diet controlled: Secondary | ICD-10-CM | POA: Diagnosis not present

## 2020-02-05 NOTE — Patient Instructions (Signed)
   Keep carb amounts consistent with each meal.   Increase protein foods and low-carb veggies to help with fulness. Less carb and more protein + small amounts of fat are better for fulness and blood sugar control.

## 2020-02-05 NOTE — Progress Notes (Signed)
•   Patient's BG record indicates fasting BGs ranging 94-113, and post-meal BGs ranging 95-124, + 2 readings of 135, 144 after high carb breakfasts.  Patient's food diary indicates eating at regular intervals, usually no bedtime snack as supper can be as late at 9pm. Some meals are above goal for carbohydrate, ie cereal in am -- patient states cereal is the only thing she is able to eat for breakfast and not feel ill. She reports feeling hungry frequently with lower carb intake.  She is drinking some regular soda with supper, as this is the only thing that helps with heartburn. She does not want to take antacid meds, but is willing to try sugar free sprite.   Provided kcal and carb-controlled meal plan, and wrote individualized menus based on patient's food preferences. Reviewed importance of controlled carb intake and advised increasing protein or low-carb vegetables as needed to promote satiety, rather than increasing carb intake. Also encouraged higher fiber, lower sugar cereal options and adding a protein source with breakfast.  Instructed patient on food safety, including avoidance of Listeriosis, and limiting mercury from fish.  Briefly discussed treatment for hypoglycemia.  Discussed importance of maintaining healthy lifestyle habits to reduce risk of Type 2 DM as well as Gestational DM with any future pregnancies (patient is not planning any more pregnancies).  Advised patient to use any remaining testing supplies to test some BGs after delivery, and to have BG tested ideally annually, as well as prior to attempting future pregnancies.

## 2020-04-14 LAB — OB RESULTS CONSOLE GC/CHLAMYDIA
Chlamydia: NEGATIVE
Gonorrhea: NEGATIVE

## 2020-04-14 LAB — OB RESULTS CONSOLE GBS: GBS: POSITIVE

## 2020-04-21 LAB — OB RESULTS CONSOLE RPR: RPR: NONREACTIVE

## 2020-04-21 LAB — OB RESULTS CONSOLE HIV ANTIBODY (ROUTINE TESTING): HIV: NONREACTIVE

## 2020-04-24 ENCOUNTER — Inpatient Hospital Stay
Admission: RE | Admit: 2020-04-24 | Discharge: 2020-04-27 | DRG: 806 | Disposition: A | Discharge status: Home / Self Care | Payer: BC Managed Care – PPO

## 2020-04-24 ENCOUNTER — Other Ambulatory Visit: Payer: Self-pay

## 2020-04-24 DIAGNOSIS — Z886 Allergy status to analgesic agent status: Secondary | ICD-10-CM | POA: Diagnosis not present

## 2020-04-24 DIAGNOSIS — O99824 Streptococcus B carrier state complicating childbirth: Secondary | ICD-10-CM | POA: Diagnosis present

## 2020-04-24 DIAGNOSIS — D62 Acute posthemorrhagic anemia: Secondary | ICD-10-CM | POA: Diagnosis not present

## 2020-04-24 DIAGNOSIS — Z20822 Contact with and (suspected) exposure to covid-19: Secondary | ICD-10-CM | POA: Diagnosis present

## 2020-04-24 DIAGNOSIS — Z3A37 37 weeks gestation of pregnancy: Secondary | ICD-10-CM

## 2020-04-24 DIAGNOSIS — O1002 Pre-existing essential hypertension complicating childbirth: Secondary | ICD-10-CM | POA: Diagnosis present

## 2020-04-24 DIAGNOSIS — E669 Obesity, unspecified: Secondary | ICD-10-CM | POA: Diagnosis present

## 2020-04-24 DIAGNOSIS — O9081 Anemia of the puerperium: Secondary | ICD-10-CM | POA: Diagnosis not present

## 2020-04-24 DIAGNOSIS — O24425 Gestational diabetes mellitus in childbirth, controlled by oral hypoglycemic drugs: Secondary | ICD-10-CM | POA: Diagnosis present

## 2020-04-24 DIAGNOSIS — O99214 Obesity complicating childbirth: Secondary | ICD-10-CM | POA: Diagnosis present

## 2020-04-24 DIAGNOSIS — O0993 Supervision of high risk pregnancy, unspecified, third trimester: Secondary | ICD-10-CM

## 2020-04-24 LAB — COMPREHENSIVE METABOLIC PANEL
ALT: 16 U/L (ref 0–44)
AST: 20 U/L (ref 15–41)
Albumin: 3 g/dL — ABNORMAL LOW (ref 3.5–5.0)
Alkaline Phosphatase: 94 U/L (ref 38–126)
Anion gap: 9 (ref 5–15)
BUN: 8 mg/dL (ref 6–20)
CO2: 19 mmol/L — ABNORMAL LOW (ref 22–32)
Calcium: 8.5 mg/dL — ABNORMAL LOW (ref 8.9–10.3)
Chloride: 111 mmol/L (ref 98–111)
Creatinine, Ser: 0.64 mg/dL (ref 0.44–1.00)
GFR, Estimated: >60 mL/min (ref >60)
Glucose, Bld: 153 mg/dL — ABNORMAL HIGH (ref 70–99)
Potassium: 3.6 mmol/L (ref 3.5–5.1)
Sodium: 139 mmol/L (ref 135–145)
Total Bilirubin: 0.5 mg/dL (ref 0.3–1.2)
Total Protein: 6.2 g/dL — ABNORMAL LOW (ref 6.5–8.1)

## 2020-04-24 LAB — GLUCOSE, CAPILLARY
Glucose-Capillary: 86 mg/dL (ref 70–99)
Glucose-Capillary: 105 mg/dL — ABNORMAL HIGH (ref 70–99)

## 2020-04-24 LAB — CBC
HCT: 34.2 % — ABNORMAL LOW (ref 36.0–46.0)
Hemoglobin: 11.5 g/dL — ABNORMAL LOW (ref 12.0–15.0)
MCH: 29.5 pg (ref 26.0–34.0)
MCHC: 33.6 g/dL (ref 30.0–36.0)
MCV: 87.7 fL (ref 80.0–100.0)
Platelets: 157 10*3/uL (ref 150–400)
RBC: 3.9 MIL/uL (ref 3.87–5.11)
RDW: 14.1 % (ref 11.5–15.5)
WBC: 8.5 10*3/uL (ref 4.0–10.5)
nRBC: 0 % (ref 0.0–0.2)

## 2020-04-24 LAB — TYPE AND SCREEN
ABO/RH(D): O POS
Antibody Screen: NEGATIVE

## 2020-04-24 LAB — RESPIRATORY PANEL BY RT PCR (FLU A&B, COVID)
Influenza A by PCR: NEGATIVE
Influenza B by PCR: NEGATIVE
SARS Coronavirus 2 by RT PCR: NEGATIVE

## 2020-04-24 LAB — PROTEIN / CREATININE RATIO, URINE
Creatinine, Urine: 19 mg/dL
Total Protein, Urine: <6 mg/dL

## 2020-04-24 LAB — ABO/RH: ABO/RH(D): O POS

## 2020-04-24 MED ORDER — LACTATED RINGERS IV SOLN: INTRAVENOUS | Status: DC

## 2020-04-24 MED ORDER — CALCIUM CARBONATE ANTACID 500 MG PO CHEW: 400.0000 mg | CHEWABLE_TABLET | Freq: Three times a day (TID) | ORAL | Status: DC | PRN

## 2020-04-24 MED ORDER — BUTORPHANOL TARTRATE 1 MG/ML IJ SOLN: 1.0000 mg | INTRAMUSCULAR | Status: DC | PRN

## 2020-04-24 MED ORDER — ONDANSETRON HCL 4 MG/2ML IJ SOLN: 4.0000 mg | Freq: Four times a day (QID) | INTRAMUSCULAR | Status: DC | PRN

## 2020-04-24 MED ORDER — AMMONIA AROMATIC IN INHA
RESPIRATORY_TRACT | Status: AC
  Filled 2020-04-24: qty 10

## 2020-04-24 MED ORDER — MISOPROSTOL 200 MCG PO TABS
ORAL_TABLET | ORAL | Status: AC
  Filled 2020-04-24: qty 4

## 2020-04-24 MED ORDER — METFORMIN HCL 500 MG PO TABS
500.0000 mg | ORAL_TABLET | Freq: Every evening | ORAL | Status: DC
  Administered 2020-04-24: 500 mg via ORAL
  Filled 2020-04-24: qty 1

## 2020-04-24 MED ORDER — LIDOCAINE HCL (PF) 1 % IJ SOLN: 30.0000 mL | INTRAMUSCULAR | Status: DC | PRN

## 2020-04-24 MED ORDER — OXYTOCIN 10 UNIT/ML IJ SOLN
INTRAMUSCULAR | Status: AC
  Filled 2020-04-24: qty 2

## 2020-04-24 MED ORDER — LACTATED RINGERS IV SOLN
500.0000 mL | INTRAVENOUS | Status: DC | PRN
  Administered 2020-04-25: 500 mL via INTRAVENOUS

## 2020-04-24 MED ORDER — OXYTOCIN-SODIUM CHLORIDE 30-0.9 UT/500ML-% IV SOLN
2.5000 [IU]/h | INTRAVENOUS | Status: DC
  Filled 2020-04-24: qty 1000

## 2020-04-24 MED ORDER — OXYTOCIN BOLUS FROM INFUSION: 333.0000 mL | Freq: Once | INTRAVENOUS | Status: DC

## 2020-04-24 MED ORDER — LIDOCAINE HCL (PF) 1 % IJ SOLN
INTRAMUSCULAR | Status: AC
  Filled 2020-04-24: qty 30

## 2020-04-24 MED ORDER — ACETAMINOPHEN 500 MG PO TABS: 1000.0000 mg | ORAL_TABLET | Freq: Four times a day (QID) | ORAL | Status: DC | PRN

## 2020-04-24 NOTE — H&P (Signed)
OB History & Physical   History of Present Illness:  Chief Complaint:   HPI:  Lindsey Gutierrez is a 34 y.o. [phone] female at [redacted]w[redacted]d dated by [place] at [redacted]w[redacted]d.  She presents to L&D for nonreactive NST in office.  Reports active fetal movement  Contractions: denies  LOF/SROM: denies  Vaginal bleeding: denies HA/Change in vision/RUQ pain: denies   Pregnancy Issues: 1. Chronic HTN 2. A2GDM 3. Obesity in pregnancy 4. AMA 5. Physiologic anemia of pregnancy  6. Trichomonas infection in pregnancy - treated  7. GBS positive   Patient Active Problem List   Diagnosis Date Noted  . Hypertension, essential 11/06/2019  . Supervision of high risk pregnancy in third trimester 09/26/2019  . Menorrhagia with regular cycle 08/29/2017     Maternal Medical History:   Past Medical History:  Diagnosis Date  . Gestational diabetes   . Hypertension   . Obesity (BMI 30.0-34.9)   . Pyelonephritis     History reviewed. No pertinent surgical history.  Allergies  Allergen Reactions  . Aspirin     Heart skips    Prior to Admission medications   Medication Sig Start Date End Date Taking? Authorizing Provider  ACCU-CHEK GUIDE test strip 4 (four) times daily. 01/28/20  Yes [provider]  Accu-Chek Softclix Lancets lancets 4 (four) times daily. 01/29/20  Yes [provider]  ferrous sulfate 325 (65 FE) MG tablet Take 325 mg by mouth daily. 02/26/20  Yes [provider]  labetalol (NORMODYNE) 200 MG tablet Take 1 tablet by mouth in the morning and at bedtime. 09/26/19 09/25/20 Yes [provider]  Prenatal 28-0.8 MG TABS Take 1 tablet by mouth daily. Patient not taking: Reported on 04/24/2020    [provider]     Prenatal care site:  [facility]  Social History: She  reports that she has never smoked. She has never used smokeless tobacco. She reports that she does not drink alcohol and does not use drugs.  Family History: family history includes  Colon cancer in her maternal grandfather; Hypertension in her maternal grandfather and maternal grandmother; Stroke in her maternal grandmother.   Review of Systems: A full review of systems was performed and negative except as noted in the HPI.     Physical Exam:  Vital Signs: BP 133/83 (BP Location: Right Arm)   Pulse (!) 126   Temp 98.2 F (36.8 C) (Oral)   Resp 16   Ht 5\' 4"  (1.626 m)   Wt 93.9 kg   LMP 08/08/2019 (Exact Date)   BMI 35.53 kg/m  Physical Exam  General: no acute distress.  HEENT: normocephalic, atraumatic Heart: regular rate & rhythm.  No murmurs/rubs/gallops Lungs: clear to auscultation bilaterally, normal respiratory effort Abdomen: soft, gravid, non-tender;  EFW: 6lbs  Pelvic:   External: Normal external female genitalia  Cervix: 1/thick/-3   Extremities: non-tender, symmetric, No edema bilaterally.  DTRs: 2+/2+  Neurologic: Alert & oriented x 3.    No results found for this or any previous visit (from the past 24 hour(s)).  Pertinent Results:  Prenatal Labs: Blood type/Rh O pos  Antibody screen neg  Rubella Immune  Varicella Immune  RPR NR  HBsAg Neg  HIV NR  GC neg  Chlamydia neg  Genetic screening negative  1 hour GTT 166  3 hour GTT 103, 212, 143, 54  GBS Positive    FHT: Baseline: 135 bpm, Variability: moderate, Accelerations: present and Decelerations: Intermittent variable and late present  TOCO: none  SVE: 1/thick/-3   Cephalic by [place] in office   No results found.  Assessment:  Lindsey Gutierrez is a 34 y.o. [phone] female at [redacted]w[redacted]d with nonreactive NST in office, Chronic HTN, A2GDM, AMA.   Plan:  1. Admit to Labor & Delivery; consents reviewed and obtained - Covid admission screen - negative  - Dr. Dalbert Garnet notified of admission   2. Fetal Well being  - Fetal Tracing: cat 2 - overall reassuring with intermittent moderate variability  - Group B Streptococcus ppx indicated: GBS pos, will start PCN with pitocin, ROM, or active  labor  - Presentation: cephalic confirmed by [place] in office    3. Routine OB: - Prenatal labs reviewed, as above - Rh pos  - CBC, T&S, RPR on admit - Gestational Carb modified diet, saline lock  4. Induction of labor  -  Contractions monitored with external toco -  Pelvis proven to 3147g -  Plan for induction with cook catheter  -  Plan for  continuous fetal monitoring -  Maternal pain control as desired - Anticipate vaginal delivery  5. A2GDM - Gestational carb modified diet ordered - Continue evening Metformin  - CBG fasting and 2hr PP  6. Chronic HTN - Currently talking labetalol 200mg   - PreE labs WNL - B/P's remain within range, no elevated blood pressures   6. Post Partum Planning: - Infant feeding: breast feeding  - Contraception: BTL - Tdap vaccine: declined  - Flu vaccine: declined   , CNM 04/24/20 12:55 PM  04/26/20, CNM Certified Nurse Midwife [facility]

## 2020-04-24 NOTE — Progress Notes (Addendum)
Inpatient Diabetes Program Recommendations  Diabetes Treatment Program Recommendations  ADA Standards of Care Diabetes in Pregnancy Target Glucose Ranges:  Fasting: 60 - 90 mg/dL Preprandial: 60 - 329 mg/dL 1 hr postprandial: Less than 140mg /dL (from first bite of meal) 2 hr postprandial: Less than 120 mg/dL (from first bite of meal)  Results for CHAIA, IKARD (MRN Dalphine Handing) as of 04/24/2020 13:37  Ref. Range 04/24/2020 12:52  Glucose Latest Ref Range: 70 - 99 mg/dL 04/26/2020 (H)   Results for SALIAH, CRISP (MRN Dalphine Handing) as of 04/24/2020 13:30  Ref. Range 06/17/2019 12:19  Hemoglobin A1C Latest Ref Range: 4.8 - 5.6 % 5.6   Review of Glycemic Control  Diabetes history: GDM; [redacted]W[redacted]D Outpatient Diabetes medications: Metformin 500 mg QPM Current orders for Inpatient glycemic control: Metformin 500 mg QPM  Inpatient Diabetes Program Recommendations:    Inpatient DM recommendations: Please consider discontinuing Metformin and use Diabetes Treatment for Pregnant/Postpartum Patients order set and ordering Novolog 0-14 units QID (fasting and 2 hour post prandial).   NOTE: Noted consult for diabetes coordinator. Chart reviewed. Noted office note on 04/21/20 by Dr. 04/23/20 which notes "Sugars were dropping, self adjusted dose of metformin to 500 mg nightly."  Patient being admitted but no notes in chart at this time and lab glucose 153 mg/dl at [facility] today.  Would recommend discontinuing Metformin and using Novolog 0-14 units QID (fasting and 2 hour post prandial). Will continue to follow along.  Thanks, 19:41, RN, MSN, CDE Diabetes Coordinator Inpatient Diabetes Program [phone] (Team Pager from 8am to 5pm)

## 2020-04-25 ENCOUNTER — Encounter: Payer: Self-pay | Admitting: Obstetrics and Gynecology

## 2020-04-25 ENCOUNTER — Inpatient Hospital Stay: Payer: BC Managed Care – PPO | Admitting: Anesthesiology

## 2020-04-25 LAB — GLUCOSE, CAPILLARY
Glucose-Capillary: 109 mg/dL — ABNORMAL HIGH (ref 70–99)
Glucose-Capillary: 92 mg/dL (ref 70–99)
Glucose-Capillary: 87 mg/dL (ref 70–99)
Glucose-Capillary: 70 mg/dL (ref 70–99)
Glucose-Capillary: 91 mg/dL (ref 70–99)
Glucose-Capillary: 156 mg/dL — ABNORMAL HIGH (ref 70–99)

## 2020-04-25 LAB — RPR: RPR Ser Ql: NONREACTIVE

## 2020-04-25 MED ORDER — LIDOCAINE HCL (PF) 1 % IJ SOLN
INTRAMUSCULAR | Status: DC | PRN
  Administered 2020-04-25: 1.5 mL via SUBCUTANEOUS

## 2020-04-25 MED ORDER — PENICILLIN G POT IN DEXTROSE 60000 UNIT/ML IV SOLN
3.0000 10*6.[IU] | INTRAVENOUS | Status: DC
  Administered 2020-04-25 (×3): 3 10*6.[IU] via INTRAVENOUS
  Filled 2020-04-25 (×9): qty 50

## 2020-04-25 MED ORDER — PHENYLEPHRINE 40 MCG/ML (10ML) SYRINGE FOR IV PUSH (FOR BLOOD PRESSURE SUPPORT)
80.0000 ug | PREFILLED_SYRINGE | INTRAVENOUS | Status: DC | PRN
  Filled 2020-04-25: qty 10

## 2020-04-25 MED ORDER — TERBUTALINE SULFATE 1 MG/ML IJ SOLN: 0.2500 mg | Freq: Once | INTRAMUSCULAR | Status: DC | PRN

## 2020-04-25 MED ORDER — DIPHENHYDRAMINE HCL 50 MG/ML IJ SOLN: 12.5000 mg | INTRAMUSCULAR | Status: DC | PRN

## 2020-04-25 MED ORDER — EPHEDRINE 5 MG/ML INJ
10.0000 mg | INTRAVENOUS | Status: DC | PRN
  Filled 2020-04-25: qty 2

## 2020-04-25 MED ORDER — FENTANYL 2.5 MCG/ML W/ROPIVACAINE 0.15% IN NS 100 ML EPIDURAL (ARMC)
EPIDURAL | Status: DC | PRN
  Administered 2020-04-25: 12 mL/h via EPIDURAL

## 2020-04-25 MED ORDER — FENTANYL 2.5 MCG/ML W/ROPIVACAINE 0.15% IN NS 100 ML EPIDURAL (ARMC)
EPIDURAL | Status: AC
  Filled 2020-04-25: qty 100

## 2020-04-25 MED ORDER — LACTATED RINGERS IV SOLN
500.0000 mL | Freq: Once | INTRAVENOUS | Status: AC
  Administered 2020-04-25: 500 mL via INTRAVENOUS

## 2020-04-25 MED ORDER — FENTANYL 2.5 MCG/ML W/ROPIVACAINE 0.15% IN NS 100 ML EPIDURAL (ARMC): 12.0000 mL/h | EPIDURAL | Status: DC

## 2020-04-25 MED ORDER — LIDOCAINE-EPINEPHRINE (PF) 1.5 %-1:200000 IJ SOLN
INTRAMUSCULAR | Status: DC | PRN
  Administered 2020-04-25: 3 mL via EPIDURAL

## 2020-04-25 MED ORDER — SODIUM CHLORIDE 0.9 % IV SOLN
5.0000 10*6.[IU] | Freq: Once | INTRAVENOUS | Status: AC
  Administered 2020-04-25: 5 10*6.[IU] via INTRAVENOUS
  Filled 2020-04-25: qty 5

## 2020-04-25 MED ORDER — OXYTOCIN-SODIUM CHLORIDE 30-0.9 UT/500ML-% IV SOLN
1.0000 m[IU]/min | INTRAVENOUS | Status: DC
  Administered 2020-04-25: 2 m[IU]/min via INTRAVENOUS

## 2020-04-25 NOTE — Progress Notes (Signed)
Labor Progress Note  Lindsey Gutierrez is a 34 y.o. [phone] at [redacted]w[redacted]d by LMP admitted for induction of labor due to chronic HTN and A2GDM.  Subjective: feeling pressure   Objective: BP 130/78 (BP Location: Left Arm)   Pulse 84   Temp 98.4 F (36.9 C) (Oral)   Resp 16   Ht 5\' 4"  (1.626 m)   Wt 93.9 kg   LMP 08/08/2019 (Exact Date)   SpO2 98%   BMI 35.53 kg/m  Notable VS details: reviewed   Fetal Assessment: FHT:  FHR: 150 bpm, variability: moderate,  accelerations:  Present,  decelerations:  Present Intermittent lates  Category/reactivity:  Category II UC:   regular, every 2-3 minutes SVE:   C/C/+2 Membrane status: SROM at 1606 Amniotic color: clear   Labs: Lab Results  Component Value Date   WBC 8.5 04/24/2020   HGB 11.5 (L) 04/24/2020   HCT 34.2 (L) 04/24/2020   MCV 87.7 04/24/2020   PLT 157 04/24/2020    Assessment / Plan: Induction of labor due to Chronic HTN and A2GDM,  progressing well on pitocin, will start pushing   Labor: Progressing normally Preeclampsia:  No s/s Fetal Wellbeing:  Category II Pain Control:  Epidural I/D:  n/a   04/26/2020, CNM 04/25/2020, 8:35 PM

## 2020-04-25 NOTE — Progress Notes (Signed)
Labor Progress Note  Lindsey Gutierrez is a 34 y.o. [phone] at [redacted]w[redacted]d by ultrasound admitted for induction of labor due to chronic HTN and A2GDM.  Subjective: feeling less pelvic pressure since Cook cath came out    Objective: BP 117/71   Pulse 94   Temp 97.8 F (36.6 C) (Oral)   Resp 16   Ht 5\' 4"  (1.626 m)   Wt 93.9 kg   LMP 08/08/2019 (Exact Date)   BMI 35.53 kg/m  Notable VS details: reviewed   Fetal Assessment: FHT:  FHR: 140 bpm, variability: moderate,  accelerations:  Present,  decelerations:  Absent Category/reactivity:  Category I UC:   Irregular, mild contractions  SVE:   3/thick/-3  Membrane status: Intact Amniotic color: N/A  Labs: Lab Results  Component Value Date   WBC 8.5 04/24/2020   HGB 11.5 (L) 04/24/2020   HCT 34.2 (L) 04/24/2020   MCV 87.7 04/24/2020   PLT 157 04/24/2020    Assessment / Plan: Early latent labor, s/p cook cath, will start oxytocin for continued active managment of induction.  Start PCN for GBS prophylaxis.   Labor: Early labor Preeclampsia:  no s/s  Fetal Wellbeing:  Category I Pain Control:  Labor support without medications I/D:  n/a   04/26/2020, CNM 04/25/2020, 5:19 AM

## 2020-04-25 NOTE — Anesthesia Preprocedure Evaluation (Signed)
Anesthesia Evaluation  Patient identified by MRN, date of birth, ID band Patient awake    Reviewed: Allergy & Precautions, NPO status , Patient's Chart, lab work & pertinent test results  History of Anesthesia Complications Negative for: history of anesthetic complications  Airway Mallampati: II       Dental   Pulmonary neg COPD, Not current smoker,           Cardiovascular hypertension, Pt. on medications and Pt. on home beta blockers (-) Past MI and (-) CHF (-) dysrhythmias (-) Valvular Problems/Murmurs     Neuro/Psych neg Seizures    GI/Hepatic Neg liver ROS, GERD (with pregnancy)  ,  Endo/Other  diabetes, Gestational  Renal/GU      Musculoskeletal   Abdominal   Peds  Hematology   Anesthesia Other Findings   Reproductive/Obstetrics                             Anesthesia Physical Anesthesia Plan  ASA: II  Anesthesia Plan: Epidural   Post-op Pain Management:    Induction:   PONV Risk Score and Plan:   Airway Management Planned:   Additional Equipment:   Intra-op Plan:   Post-operative Plan:   Informed Consent: I have reviewed the patients History and Physical, chart, labs and discussed the procedure including the risks, benefits and alternatives for the proposed anesthesia with the patient or authorized representative who has indicated his/her understanding and acceptance.       Plan Discussed with:   Anesthesia Plan Comments:         Anesthesia Quick Evaluation

## 2020-04-25 NOTE — Progress Notes (Signed)
Labor Progress Note  Lindsey Gutierrez is a 34 y.o. [phone] at [redacted]w[redacted]d by ultrasound admitted for induction of labor due to chronic HTN, A2GDM.  Subjective: feels like contractions are getting stronger   Objective: BP 118/66   Pulse 88   Temp 98.1 F (36.7 C) (Oral)   Resp 16   Ht 5\' 4"  (1.626 m)   Wt 93.9 kg   LMP 08/08/2019 (Exact Date)   BMI 35.53 kg/m  Notable VS details: reviewed   Fetal Assessment: FHT:  FHR: 145 bpm, variability: moderate,  accelerations:  Present,  decelerations:  Absent Category/reactivity:  Category I UC:   regular, every 3-5 minutes SVE:   4/thick/-2/ant Membrane status: Bulging  Amniotic color: N/A  Labs: Lab Results  Component Value Date   WBC 8.5 04/24/2020   HGB 11.5 (L) 04/24/2020   HCT 34.2 (L) 04/24/2020   MCV 87.7 04/24/2020   PLT 157 04/24/2020    Assessment / Plan: Induction of labor due to chronic HTN and A2GDM,  progressing well on pitocin  Labor: Progressing on Pitocin, will continue to increase then AROM Preeclampsia:  no s/s Fetal Wellbeing:  Category I Pain Control:  Labor support without medications I/D:  n/a   04/26/2020, CNM 04/25/2020, 12:42 PM

## 2020-04-25 NOTE — Progress Notes (Signed)
Labor Progress Note  Lindsey Gutierrez is a 34 y.o. [phone] at [redacted]w[redacted]d by ultrasound admitted for induction of labor due to Chronic HTN and A2GDM.  Subjective: comfortable with epidural  Objective: BP 130/78 (BP Location: Left Arm)   Pulse 84   Temp 98.4 F (36.9 C) (Oral)   Resp 16   Ht 5\' 4"  (1.626 m)   Wt 93.9 kg   LMP 08/08/2019 (Exact Date)   SpO2 98%   BMI 35.53 kg/m  Notable VS details: reviewed   Fetal Assessment: FHT:  FHR: 150 bpm, variability: moderate,  accelerations:  Present,  decelerations:  Present intermittent lates Category/reactivity:  Category II UC:   regular, every 2-3 minutes, MVU 185 SVE:   5/90/-2/soft/ant Membrane status: AROM at 1606 Amniotic color: Clear  Labs: Lab Results  Component Value Date   WBC 8.5 04/24/2020   HGB 11.5 (L) 04/24/2020   HCT 34.2 (L) 04/24/2020   MCV 87.7 04/24/2020   PLT 157 04/24/2020    Assessment / Plan: Induction of labor due to chronic HTN, A2GDM,  progressing well on pitocin  Labor: Progressing normally and will continue to titrate oxytocin Preeclampsia:  No s/s Fetal Wellbeing:  Category II Pain Control:  Epidural I/D:  n/a   04/26/2020, CNM 04/25/2020, 7:18 PM

## 2020-04-25 NOTE — Discharge Summary (Signed)
Obstetrical Discharge Summary  Patient Name: Lindsey Gutierrez DOB: 09/28/1985 MRN: [id number]  Date of Admission: 04/24/2020  Date of Delivery: 04/25/2020 Delivered by: Margaretmary Eddy, CNM  Date of Discharge: 04/27/2020  Primary OB: [facility] EPP:[id number] last menstrual period was 08/08/2019 (exact date). EDC Estimated Date of Delivery: 05/14/20 Gestational Age at Delivery: [redacted]w[redacted]d   Antepartum complications:  1. Chronic HTN 2. A2GDM 3. Obesity in pregnancy 4. AMA 5. Physiologic anemia of pregnancy  6. Trichomonas infection in pregnancy - treated  7. GBS positive   Admitting Diagnosis: IOL for chronic HTN and A2GDM Secondary Diagnosis: Patient Active Problem List   Diagnosis Date Noted  . Hypertension, essential 11/06/2019  . Supervision of high risk pregnancy in third trimester 09/26/2019  . Menorrhagia with regular cycle 08/29/2017    Augmentation: AROM, Pitocin and IP Foley Complications: None Intrapartum complications/course: Lindsey Gutierrez presented to L&D d/t nonreactive NST in office. Decision made to proceed with IOL d/t pregnancy complicated by chronic HTN, A2GDM, and AMA.  Cervical ripening was done with [facility] Catheter.  Oxytocin was started for induction and she progressed well after AROM.  She had a spontaneous urge to push at C/C/+1.  Fetal position was ROP.  Maternal positioning was utililized to help with rotation along with maternal pushing efforts.  She pushed effectively for 30 min for a spontaneous birth.  Delivery Type: spontaneous vaginal delivery Anesthesia: epidural Placenta: spontaneous Laceration: None Episiotomy: none Newborn Data: Live born female "Lindsey Gutierrez" Birth Weight:  7#6 APGAR: 7/9   Newborn Delivery   Birth date/time: 04/25/2020 21:17:00 Delivery type: Vaginal, Spontaneous      Postpartum Procedures: none  Edinburgh:  Edinburgh Postnatal Depression Scale Screening Tool 04/27/2020  I have been able to laugh and see the funny side of  things. 0  I have looked forward with enjoyment to things. 0  I have blamed myself unnecessarily when things went wrong. 0  I have been anxious or worried for no good reason. 0  I have felt scared or panicky for no good reason. 0  Things have been getting on top of me. 0  I have been so unhappy that I have had difficulty sleeping. 0  I have felt sad or miserable. 0  I have been so unhappy that I have been crying. 0  The thought of harming myself has occurred to me. 0  Edinburgh Postnatal Depression Scale Total 0     Post partum course:  Patient had an uncomplicated postpartum course. She was changed to Procardia 30mg  XL daily for [facility]. By time of discharge on PPD#2, her pain was controlled on oral pain medications; she had appropriate lochia and was ambulating, voiding without difficulty and tolerating regular diet.  She was deemed stable for discharge to home.      Discharge Physical Exam:  BP (!) 141/88 (BP Location: Right Arm)   Pulse 83   Temp 98.1 F (36.7 C) (Oral)   Resp 18   Ht 5\' 4"  (1.626 m)   Wt 93.9 kg   LMP 08/08/2019 (Exact Date)   SpO2 98%   Breastfeeding Unknown   BMI 35.53 kg/m   General: NAD CV: RRR Pulm: CTABL, nl effort ABD: s/nd/nt, fundus firm and below the umbilicus Lochia: scant Perineum:minimal edema/intact DVT Evaluation: LE non-ttp, no evidence of DVT on exam.  Hemoglobin  Date Value Ref Range Status  04/26/2020 10.2 (L) 12.0 - 15.0 g/dL Final   HCT  Date Value Ref Range Status  04/26/2020 30.0 (L) 36 -  46 % Final     Disposition: stable, discharge to home. Baby Feeding: breastmilk Baby Disposition: home with mom  Rh Immune globulin given: O pos Rubella vaccine given: Immune Varivax vaccine given: Immune Flu vaccine given in AP or PP setting: declined  Tdap vaccine given in AP or PP setting: declined   Contraception: TBD  Prenatal Labs:  Blood type/Rh O pos  Antibody screen neg  Rubella Immune  Varicella Immune  RPR NR   HBsAg Neg  HIV NR  GC neg  Chlamydia neg  Genetic screening negative  1 hour GTT 166  3 hour GTT 103, 212, 143, 54  GBS Positive     Plan:  Lindsey Gutierrez was discharged to home in good condition. Follow-up appointment with delivering provider in 1 weeks for BP check.  Discharge Medications: Allergies as of 04/27/2020      Reactions   Aspirin    Heart skips      Medication List    TAKE these medications   acetaminophen 500 MG tablet Commonly known as: TYLENOL Take 2 tablets (1,000 mg total) by mouth every 6 (six) hours as needed (for pain scale < 4).   docusate sodium 100 MG capsule Commonly known as: COLACE Take 1 capsule (100 mg total) by mouth 2 (two) times daily.   ferrous sulfate 325 (65 FE) MG tablet Take 325 mg by mouth daily.   ibuprofen 600 MG tablet Commonly known as: ADVIL Take 1 tablet (600 mg total) by mouth every 6 (six) hours.   NIFEdipine 30 MG 24 hr tablet Commonly known as: ADALAT CC Take 1 tablet (30 mg total) by mouth daily. Start taking on: April 28, 2020   Prenatal 28-0.8 MG Tabs Take 1 tablet by mouth daily.        Follow-up Information    Gustavo Lah, CNM. Schedule an appointment as soon as possible for a visit in 6 week(s).   Specialty: Certified Nurse Midwife Why: postpartum visit  Contact information: [address] [place] [address] [phone]        [facility] Follow up in 1 week(s).   Why: Postpartum blood pressure check Contact information: [address] [place] [address] [phone]              Signed: Randa Ngo, CNM 04/27/2020 11:48 AM

## 2020-04-25 NOTE — Progress Notes (Signed)
Labor Progress Note  Lindsey Gutierrez is a 34 y.o. [phone] at [redacted]w[redacted]d by ultrasound admitted for induction of labor due to chronic HTN, A2GDM.  Subjective: feeling more comfortable after epidural   Objective: BP 136/84    Pulse 92    Temp 98.2 F (36.8 C) (Oral)    Resp 16    Ht 5\' 4"  (1.626 m)    Wt 93.9 kg    LMP 08/08/2019 (Exact Date)    SpO2 99%    BMI 35.53 kg/m  Notable VS details: reviewed   Fetal Assessment: FHT:  FHR: 140 bpm, variability: moderate,  accelerations:  Present,  decelerations:  Absent Category/reactivity:  Category I UC:   regular, every 2-3 minutes -> IUPC placed to better monitor contractions  SVE:   4-5/50/-2/soft/ant Membrane status: AROM at 1606 Amniotic color: Clear  Labs: Lab Results  Component Value Date   WBC 8.5 04/24/2020   HGB 11.5 (L) 04/24/2020   HCT 34.2 (L) 04/24/2020   MCV 87.7 04/24/2020   PLT 157 04/24/2020    Assessment / Plan: Induction of labor due to chronic HTN and A2GDM,  progressing well on pitocin.  Blood sugars have been within in range.  Will continue to monitor.    Labor: Progressing normally, will continue to titrate oxytocin  Preeclampsia:  no s/s Fetal Wellbeing:  Category I Pain Control:  Epidural I/D:  n/a  04/26/2020, CNM 04/25/2020, 4:20 PM

## 2020-04-25 NOTE — Anesthesia Procedure Notes (Signed)
Epidural Patient location during procedure: OB Start time: 04/25/2020 2:26 PM End time: 04/25/2020 2:48 PM  Staffing Performed: anesthesiologist   Preanesthetic Checklist Completed: patient identified, IV checked, site marked, risks and benefits discussed, surgical consent, monitors and equipment checked, pre-op evaluation and timeout performed  Epidural Patient position: sitting Prep: ChloraPrep Patient monitoring: heart rate, continuous pulse ox and blood pressure Approach: midline Location: L4-L5 Injection technique: LOR saline  Needle:  Needle type: Tuohy  Needle gauge: 17 G Needle length: 9 cm and 9 Needle insertion depth: 8 cm Catheter type: closed end flexible Catheter size: 20 Guage Catheter at skin depth: 13 cm Test dose: negative and 1.5% lidocaine with Epi 1:200 K  Assessment Events: blood not aspirated, injection not painful, no injection resistance, no paresthesia and negative IV test  Additional Notes   Patient tolerated the insertion well without complications.Reason for block:procedure for pain

## 2020-04-26 LAB — CBC
HCT: 30 % — ABNORMAL LOW (ref 36.0–46.0)
Hemoglobin: 10.2 g/dL — ABNORMAL LOW (ref 12.0–15.0)
MCH: 29.8 pg (ref 26.0–34.0)
MCHC: 34 g/dL (ref 30.0–36.0)
MCV: 87.7 fL (ref 80.0–100.0)
Platelets: 149 10*3/uL — ABNORMAL LOW (ref 150–400)
RBC: 3.42 MIL/uL — ABNORMAL LOW (ref 3.87–5.11)
RDW: 14.1 % (ref 11.5–15.5)
WBC: 12.6 10*3/uL — ABNORMAL HIGH (ref 4.0–10.5)
nRBC: 0 % (ref 0.0–0.2)

## 2020-04-26 MED ORDER — ACETAMINOPHEN 500 MG PO TABS: 1000.0000 mg | ORAL_TABLET | Freq: Four times a day (QID) | ORAL | Status: DC | PRN

## 2020-04-26 MED ORDER — ONDANSETRON HCL 4 MG PO TABS: 4.0000 mg | ORAL_TABLET | ORAL | Status: DC | PRN

## 2020-04-26 MED ORDER — IBUPROFEN 600 MG PO TABS
600.0000 mg | ORAL_TABLET | Freq: Four times a day (QID) | ORAL | Status: DC
  Administered 2020-04-26 – 2020-04-27 (×5): 600 mg via ORAL
  Filled 2020-04-26 (×6): qty 1

## 2020-04-26 MED ORDER — DIPHENHYDRAMINE HCL 25 MG PO CAPS: 25.0000 mg | ORAL_CAPSULE | Freq: Four times a day (QID) | ORAL | Status: DC | PRN

## 2020-04-26 MED ORDER — NIFEDIPINE ER OSMOTIC RELEASE 30 MG PO TB24
30.0000 mg | ORAL_TABLET | Freq: Every day | ORAL | Status: DC
  Administered 2020-04-26 – 2020-04-27 (×2): 30 mg via ORAL
  Filled 2020-04-26 (×2): qty 1

## 2020-04-26 MED ORDER — COCONUT OIL OIL
1.0000 "application " | TOPICAL_OIL | Status: DC | PRN
  Filled 2020-04-26: qty 120

## 2020-04-26 MED ORDER — WITCH HAZEL-GLYCERIN EX PADS: 1.0000 "application " | MEDICATED_PAD | CUTANEOUS | Status: DC

## 2020-04-26 MED ORDER — DOCUSATE SODIUM 100 MG PO CAPS
100.0000 mg | ORAL_CAPSULE | Freq: Two times a day (BID) | ORAL | Status: DC
  Administered 2020-04-26: 100 mg via ORAL
  Filled 2020-04-26 (×2): qty 1

## 2020-04-26 MED ORDER — SIMETHICONE 80 MG PO CHEW: 80.0000 mg | CHEWABLE_TABLET | ORAL | Status: DC | PRN

## 2020-04-26 MED ORDER — BENZOCAINE-MENTHOL 20-0.5 % EX AERO
1.0000 "application " | INHALATION_SPRAY | CUTANEOUS | Status: DC | PRN
  Administered 2020-04-26: 1 via TOPICAL
  Filled 2020-04-26: qty 56

## 2020-04-26 MED ORDER — PRENATAL MULTIVITAMIN CH
1.0000 | ORAL_TABLET | Freq: Every day | ORAL | Status: DC
  Administered 2020-04-27: 1 via ORAL
  Filled 2020-04-26 (×2): qty 1

## 2020-04-26 MED ORDER — DIBUCAINE (PERIANAL) 1 % EX OINT: 1.0000 "application " | TOPICAL_OINTMENT | CUTANEOUS | Status: DC | PRN

## 2020-04-26 MED ORDER — ONDANSETRON HCL 4 MG/2ML IJ SOLN: 4.0000 mg | INTRAMUSCULAR | Status: DC | PRN

## 2020-04-26 MED ORDER — FERROUS SULFATE 325 (65 FE) MG PO TABS
325.0000 mg | ORAL_TABLET | Freq: Two times a day (BID) | ORAL | Status: DC
  Administered 2020-04-26 – 2020-04-27 (×2): 325 mg via ORAL
  Filled 2020-04-26 (×2): qty 1

## 2020-04-26 NOTE — Lactation Note (Signed)
This note was copied from a baby's chart. Lactation Consultation Note  Patient Name: Lindsey Gutierrez [id number] Date: 04/26/2020 Reason for consult: Follow-up assessment  Lindsey Gutierrez was born at 37.2 weeks when mom was induced for Diabetes and CHTN.  He had temperature issues with it being 97.8 after this breast feeding.  Kept him skin to skin with warm hat and blanket added.  He has been sleepy.  Assisted mom with gently arousing him by changing a void and bowel movement.  Assisted with positioning with pillow support with Lindsey Gutierrez in football hold skin to skin.  He latched with minimal assistance and he began good rhythmic sucking which mom could feel as strong tugs at the breast.  He nursed for 15 minutes before coming off the breast with hiccups.  After hiccups resolved, he started rooting for the breast again.  Pointed out feeding cues and encouraged mom to put Lindsey Gutierrez to the breast whenever he demonstrated hunger cues.  Mom put him back to the same breast in modified cross cradle hold skin to skin where he breast fed for another 15 minutes.  Mom denies any breast or nipple pain. Demonstrated how to hand express colostrum after breast feeding to rub on nipples to prevent bacteria, lubricate and prevent trauma or discomfort.  Coconut oil given with instructions in use.  Mom breast fed her 43 year old for 4 weeks and her 35 year old for 2 weeks.  She perceived that she did not have enough milk so started giving bottles of formula.  Discussed supply and demand and growth spurts stressing the need to breast feed frequently on demand to bring in mature milk, ensure a plentiful milk supply and prevent engorgement.  Mom has a Medela DEBP which she plans to use if milk supply starts to dwindle.  Hand out given on what to expect the first 4 days of life with feeding reviewing normal newborn stomach size, normal course of lactation, adequate intake and out put and routine newborn feeding patterns.  Lactation [employer] and hand out on LLL supports given and reviewed.  Lactation name and number written on white board and encouraged to call with any questions, concerns or assistance.    Maternal Data Formula Feeding for Exclusion: No Has patient been taught Hand Expression?: Yes Does the patient have breastfeeding experience prior to this delivery?: Yes  Feeding Feeding Type: Breast Fed  LATCH Score Latch: Repeated attempts needed to sustain latch, nipple held in mouth throughout feeding, stimulation needed to elicit sucking reflex.  Audible Swallowing: A few with stimulation  Type of Nipple: Everted at rest and after stimulation  Comfort (Breast/Nipple): Soft / non-tender  Hold (Positioning): Assistance needed to correctly position infant at breast and maintain latch.  LATCH Score: 7  Interventions Interventions: Breast feeding basics reviewed;Assisted with latch;Skin to skin;Breast massage;Hand express;Breast compression;Adjust position;Support pillows;Position options;Coconut oil  Lactation Tools Discussed/Used Tools: Coconut oil WIC Program: Yes   Consult Status Consult Status: Follow-up Date: 04/26/20 Follow-up type: Call as needed    Louis Meckel 04/26/2020, 11:53 AM

## 2020-04-26 NOTE — Anesthesia Postprocedure Evaluation (Signed)
Anesthesia Post Note  Patient: Lindsey Gutierrez  Procedure(s) Performed: AN AD HOC LABOR EPIDURAL  Patient location during evaluation: Mother Baby Anesthesia Type: Epidural Level of consciousness: awake and alert Pain management: pain level controlled Vital Signs Assessment: post-procedure vital signs reviewed and stable Respiratory status: spontaneous breathing, nonlabored ventilation and respiratory function stable Cardiovascular status: stable Postop Assessment: no headache, no backache and epidural receding Anesthetic complications: no   No complications documented.   Last Vitals:  Vitals:   04/26/20 0752 04/26/20 1118  BP: 122/81 127/75  Pulse: 88 86  Resp: 18 20  Temp: 37.1 C 36.8 C  SpO2: 98%     Last Pain:  Vitals:   04/26/20 1118  TempSrc: Oral  PainSc:                  Ronnell Clinger K

## 2020-04-26 NOTE — Progress Notes (Signed)
Post Partum Day 1  Subjective: no complaints, up ad lib, voiding and tolerating PO  Doing well, no concerns. Ambulating without difficulty, pain managed with PO meds, tolerating regular diet, and voiding without difficulty.   No fever/chills, chest pain, shortness of breath, nausea/vomiting, or leg pain. No nipple or breast pain. No headache, visual changes, or RUQ/epigastric pain.  Objective: BP 122/81 (BP Location: Left Arm)   Pulse 88   Temp 98.7 F (37.1 C) (Oral)   Resp 18   Ht 5\' 4"  (1.626 m)   Wt 93.9 kg   LMP 08/08/2019 (Exact Date)   SpO2 98%   Breastfeeding Unknown   BMI 35.53 kg/m    Physical Exam:  General: alert, cooperative and no distress Breasts: soft/nontender  CV: RRR Pulm: nl effort, CTABL Abdomen: soft, non-tender, active bowel sounds Uterine Fundus: firm Perineum: minimal edema, intact Lochia: appropriate DVT Evaluation: No evidence of DVT seen on physical exam.  Recent Labs    04/24/20 1252 04/26/20 0529  HGB 11.5* 10.2*  HCT 34.2* 30.0*  WBC 8.5 12.6*  PLT 157 149*    Assessment/Plan: 34 y.o. 04/28/20 postpartum day # 1  -Continue routine postpartum care -Lactation consult PRN for breastfeeding  -No severe range b/p's, will start on procardia today.  -Discussed contraceptive options including implant, IUDs hormonal and non-hormonal, injection, pills/ring/patch, condoms, and NFP. Still considering postpartum BTL but thinking about doing it 4-6 weeks postpartum.  Does not desire general anesthesia, will only want the BTL if she can do it under spinal anesthesia.  -Acute blood loss anemia - hemodynamically stable and asymptomatic; start PO ferrous sulfate BID with stool softeners  -Immunization status: all immunizations up to date, declined flu vaccine   Disposition: Continue inpatient postpartum care    LOS: 2 days   [id number], CNM 04/26/2020, 10:01 AM   ----- 04/28/2020  Certified Nurse Midwife [facility]

## 2020-04-27 ENCOUNTER — Other Ambulatory Visit: Payer: Self-pay | Admitting: Obstetrics and Gynecology

## 2020-04-27 ENCOUNTER — Ambulatory Visit: Payer: Self-pay

## 2020-04-27 MED ORDER — ACETAMINOPHEN 500 MG PO TABS: 1000.0000 mg | ORAL_TABLET | Freq: Four times a day (QID) | ORAL | 0 refills | Status: DC | PRN

## 2020-04-27 MED ORDER — DOCUSATE SODIUM 100 MG PO CAPS: 100.0000 mg | ORAL_CAPSULE | Freq: Two times a day (BID) | ORAL | 0 refills | Status: DC

## 2020-04-27 MED ORDER — IBUPROFEN 600 MG PO TABS: 600.0000 mg | ORAL_TABLET | Freq: Four times a day (QID) | ORAL | 0 refills | Status: DC

## 2020-04-27 MED ORDER — NIFEDIPINE ER 30 MG PO TB24: 30.0000 mg | ORAL_TABLET | Freq: Every day | ORAL | 0 refills | Status: DC

## 2020-04-27 NOTE — Consult Note (Addendum)
Brief Pharmacy Note  Pharmacy has been consulted for our anti-hypertensive meds to beds program.   Patient has agreed to the program, and is aware she will be responsible for her regular outpatient prescription copay.   The patient's preferred pharmacy in Epic has been changed to [facility] employee pharmacy (so the electronic prescriptions can be sent there instead of their prior designated pharmacy.)    The electronic rx must be sent to employee pharmacy by 4:30pm the day of discharge for the meds to beds delivery to be possible.  Thank you for including pharmacy in this patient's care.  Martyn Malay, Clinical Pharmacist 04/27/2020 9:37 AM  Addendum: Meds provided bedside @ 1245 11/1 and pt counseled regarding medication  Albina Billet, PharmD, BCPS Clinical Pharmacist 04/27/2020 12:55 PM

## 2020-04-27 NOTE — Progress Notes (Signed)
Discharge order received from doctor. Pt received blood pressure medication from [facility] pharmacy.  Reviewed discharge instructions and prescriptions with patient and answered all questions. Follow up appointment given. Patient verbalized understanding. ID bands checked. Patient discharged home with infant via wheelchair by nursing/auxillary.    Inge Rise, RN

## 2020-04-27 NOTE — Discharge Instructions (Signed)

## 2020-04-27 NOTE — Lactation Note (Signed)
This note was copied from a baby's chart. Lactation Consultation Note  Patient Name: Lindsey Gutierrez [id number] Date: 04/27/2020 Reason for consult: Follow-up assessment;Early term 37-38.6wks;Other (Comment) (Lindsey Gutierrez circ'd this am & going to be discharged home today)  After Lindsey Gutierrez woke up after circumcision, he started cluster feeding.  Mom is some tender from frequent breastfeeding.  Coconut oil and comfort gels given with instructions in use.  Reinforced teaching and reviewed contact numbers if further lactation assistance needed or had questions or concerns.   Maternal Data Formula Feeding for Exclusion: No Has patient been taught Hand Expression?: Yes Does the patient have breastfeeding experience prior to this delivery?: Yes  Feeding Feeding Type: Breast Fed  LATCH Score Latch: Repeated attempts needed to sustain latch, nipple held in mouth throughout feeding, stimulation needed to elicit sucking reflex.  Audible Swallowing: A few with stimulation  Type of Nipple: Everted at rest and after stimulation  Comfort (Breast/Nipple): Filling, red/small blisters or bruises, mild/mod discomfort  Hold (Positioning): No assistance needed to correctly position infant at breast.  LATCH Score: 7  Interventions Interventions: Breast massage;Breast compression;Support pillows;Coconut oil;Comfort gels  Lactation Tools Discussed/Used Tools: Coconut oil;Comfort gels WIC Program: Yes   Consult Status Consult Status: PRN Follow-up type: Call as needed    Louis Meckel 04/27/2020, 2:50 PM

## 2020-07-03 IMAGING — MR MR KNEE*R* W/O CM
6 series · 40 of 40 positions shown · non-contrast
Comparison: Radiographs on 12/22/2010

CLINICAL DATA: Medial knee pain and swelling secondary to a
hyperextension injury 2-3 weeks ago.

EXAM:
MRI OF THE RIGHT KNEE WITHOUT CONTRAST
TECHNIQUE: Multiplanar, multisequence MR imaging of the knee was performed. No
intravenous contrast was administered.

[Series 8: T2 fat-sat · axial · right · 4.0mm · 0.50mm/px · z∈[-28,+96]mm · 6 of 26 slices shown (1 of 3)]
[im 1/26]
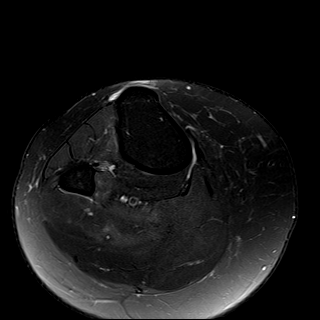
[im 6/26]
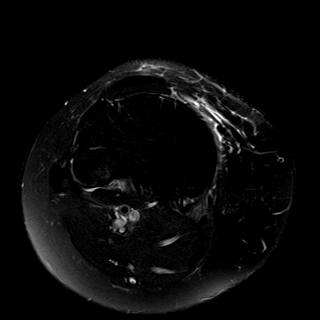
[im 11/26]
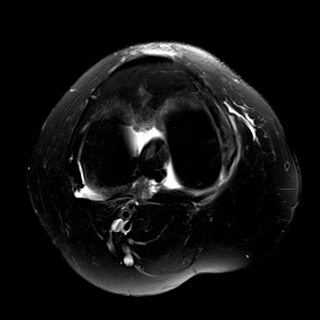
[im 16/26]
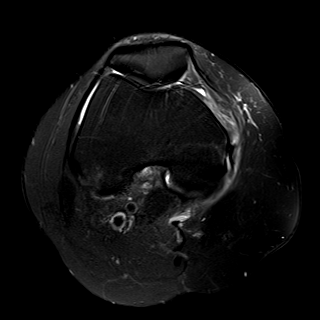
[im 21/26]
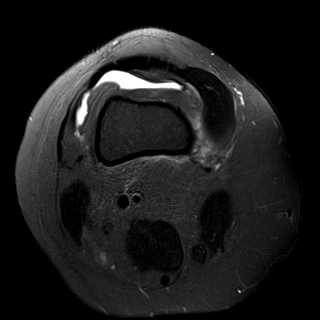
[im 26/26]
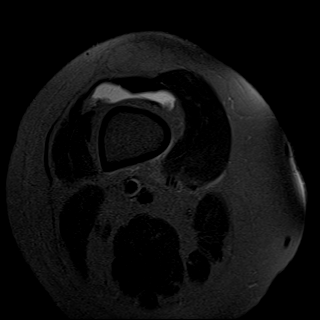

[Series 9: T2 fat-sat · coronal · right · 4.0mm · 0.59mm/px · 6 of 30 slices shown (2 of 3)]
[im 1/30]
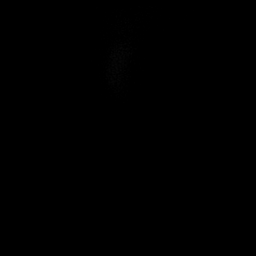
[im 6/30]
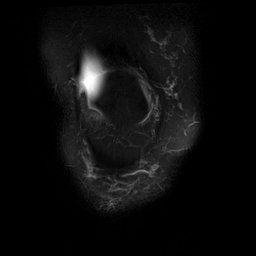
[im 12/30]
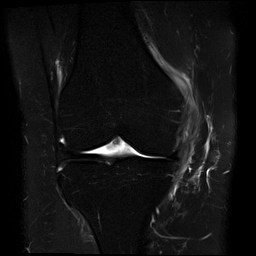
[im 18/30]
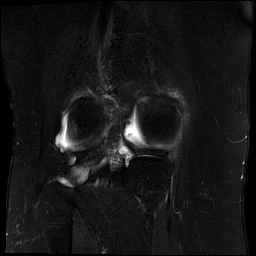
[im 24/30]
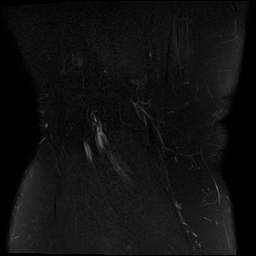
[im 30/30]
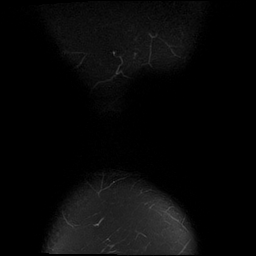

[Series 10: T1 · coronal · right · 4.0mm · 0.59mm/px · 6 of 30 slices shown]
[im 1/30]
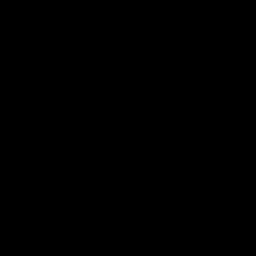
[im 6/30]
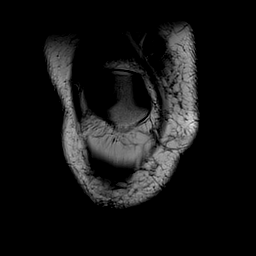
[im 12/30]
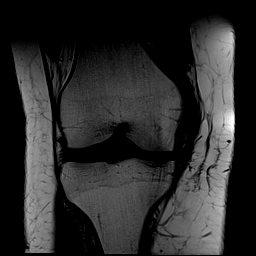
[im 18/30]
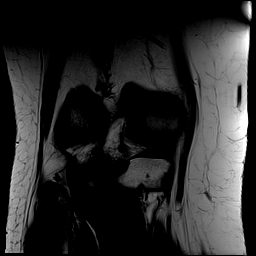
[im 24/30]
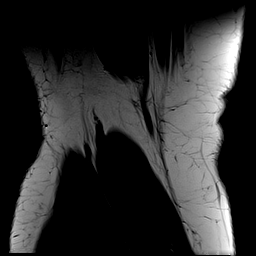
[im 30/30]
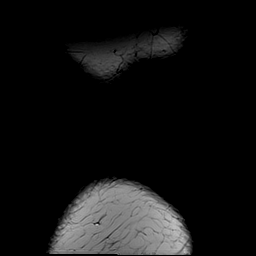

[Series 11: PD fat-sat · coronal · right · 4.0mm · 0.59mm/px · 6 of 30 slices shown (1 of 2)]
[im 1/30]
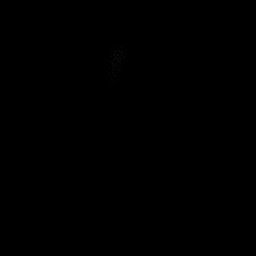
[im 6/30]
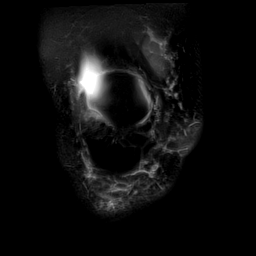
[im 12/30]
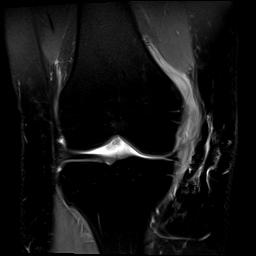
[im 18/30]
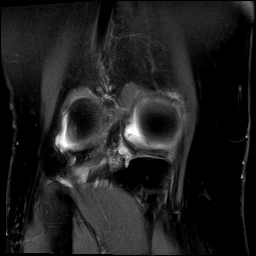
[im 24/30]
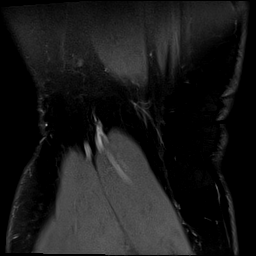
[im 30/30]
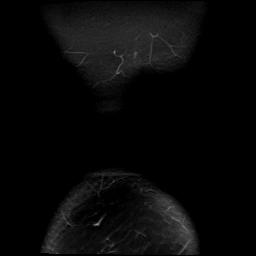

[Series 12: PD fat-sat · sagittal · right · 3.0mm · 0.59mm/px · 8 of 36 slices shown (2 of 2)]
[im 1/36]
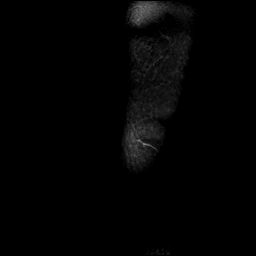
[im 6/36]
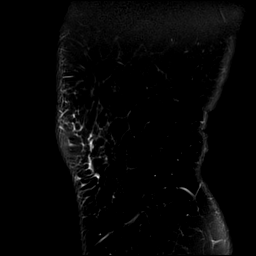
[im 11/36]
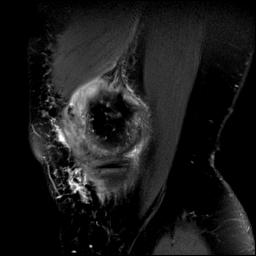
[im 16/36]
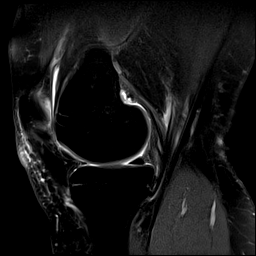
[im 21/36]
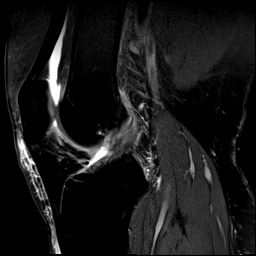
[im 26/36]
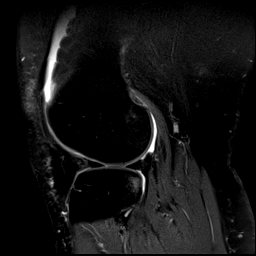
[im 31/36]
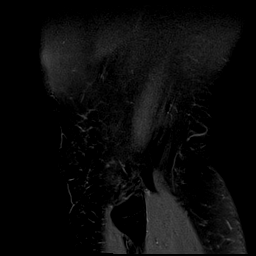
[im 36/36]
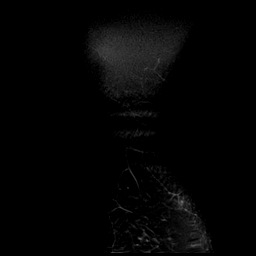

[Series 13: T2 fat-sat · sagittal · right · 3.0mm · 0.59mm/px · 8 of 36 slices shown (3 of 3)]
[im 1/36]
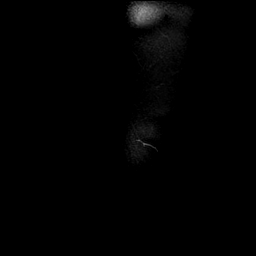
[im 6/36]
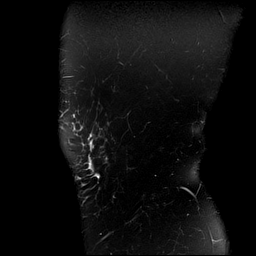
[im 11/36]
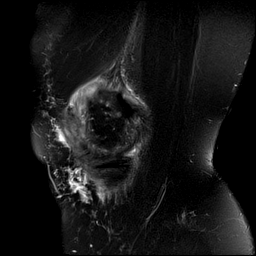
[im 16/36]
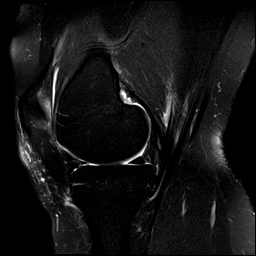
[im 21/36]
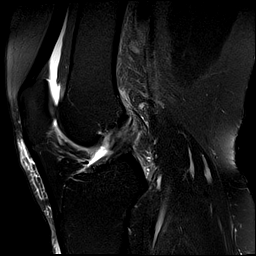
[im 26/36]
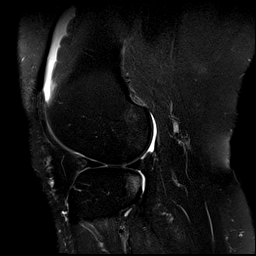
[im 31/36]
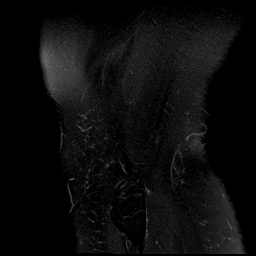
[im 36/36]
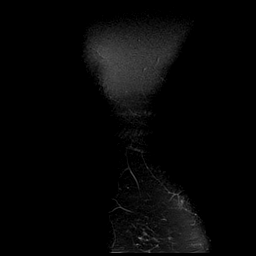

[40 of 40 positions shown; findings below may reference images not displayed]

FINDINGS: MENISCI

Medial meniscus:  Normal.

Lateral meniscus:  Normal.

LIGAMENTS

Cruciates: There is a partial-thickness tear of the proximal fibers
of the ACL. Majority of the ligament is intact. PCL is normal.

Collaterals:  Grade 2 sprain of the proximal MCL. The LCL is normal.

CARTILAGE

Patellofemoral:  Normal.

Medial:  Normal.

Lateral:  Normal.

Joint: Moderate joint effusion. Normal Hoffa's fat pad. No plical
thickening.

Popliteal Fossa:  No Baker cyst. Intact popliteus tendon.

Extensor Mechanism:  Normal.

Bones: Contusions of the posterolateral aspect of the lateral
femoral condyle and of the posterior aspect of the lateral tibial
plateau, probably secondary to a valgus injury with the knee flexed.

Other: Soft tissue edema overlying the medial patellofemoral
ligament and the medial collateral ligament.
IMPRESSION: 1. Partial-thickness tear (sprain) of the proximal fibers of the
ACL.
2. Grade 2 sprain of the proximal MCL.
3. Contusions of the posterolateral aspect of the lateral femoral
condyle and posterior aspect of the lateral tibial plateau,
consistent with a valgus injury.
4. Moderate joint effusion.

## 2020-12-16 ENCOUNTER — Other Ambulatory Visit: Payer: Self-pay | Admitting: Obstetrics and Gynecology

## 2020-12-22 NOTE — Progress Notes (Signed)
Look at H+P for full note

## 2020-12-23 NOTE — H&P (Signed)
Ms. Barrie is a 35 y.o. female here for l/s BTL . Pt is a G4 P3 svdx 3 and is intereted in discussing contraception . Tried OCPs and IUD in past . Not interested in using either again . 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  Past Surgical History:  has a past surgical history that includes WISDOM TEETH. Family History: family history includes High blood pressure (Hypertension) in her mother; No Known Problems in her brother and father. Social History:  reports that she has never smoked. She has never used smokeless tobacco. She reports that she does not drink alcohol and does not use drugs. OB/GYN History:          OB History     Gravida  4   Para  3   Term  3   Preterm      AB  1   Living  3      SAB  1   IAB      Ectopic      Molar      Multiple      Live Births  3             Allergies: is allergic to aspirin. Medications:   Current Outpatient Medications:    atenoloL (TENORMIN) 50 MG tablet, Take 50 mg by mouth once daily for Blood Pressure, Disp: , Rfl:    blood sugar diagnostic, disc (BLOOD GLUCOSE DIAGNOSTIC, DISC) test strip, Use once daily Use as instructed. (Patient not taking: Reported on 12/01/2020), Disp: 100 each, Rfl: 3   ferrous sulfate 325 (65 FE) MG tablet, Take 325 mg by mouth daily with breakfast (Patient not taking: No sig reported), Disp: , Rfl:    ibuprofen (MOTRIN) 600 MG tablet, Take 600 mg by mouth every 6 (six) hours as needed for Pain (Patient not taking: No sig reported), Disp: , Rfl:    labetaloL (TRANDATE) 200 MG tablet, Take 1 tablet (200 mg total) by mouth once daily (Patient not taking: Reported on 12/01/2020), Disp: 30 tablet, Rfl: 11   lancets, Use 1 each once daily Use as instructed. (Patient not taking: Reported on 12/01/2020), Disp: 100 each, Rfl: 3    norelgestromin-ethinyl estradiol (ORTHO EVRA) 150-35 mcg/24 hr patch, Place 1 patch onto the skin once a week (Patient not taking: Reported on 12/01/2020), Disp: 3 patch, Rfl: 11   prenatal vit-iron fum-folic ac (PRENAVITE) tablet, Take 1 tablet by mouth once daily (Patient not taking: No sig reported), Disp: , Rfl:    Review of Systems: General:                      No fatigue or weight loss Eyes:                           No vision changes Ears:                            No hearing difficulty Respiratory:                No cough or shortness of breath Pulmonary:                  No asthma or shortness of  breath Cardiovascular:           No chest pain, palpitations, dyspnea on exertion Gastrointestinal:          No abdominal bloating, chronic diarrhea, constipations, masses, pain or hematochezia Genitourinary:             No hematuria, dysuria, abnormal vaginal discharge, pelvic pain, Menometrorrhagia Lymphatic:                   No swollen lymph nodes Musculoskeletal:         No muscle weakness Neurologic:                  No extremity weakness, syncope, seizure disorder Psychiatric:                  No history of depression, delusions or suicidal/homicidal ideation      Exam:       Vitals:    12/23/20 1429  BP: 128/84  Pulse: 65      Body mass index is 33.97 kg/m.   WDWN white/  female in NAD   Lungs: CTA  CV : RRR without murmur   Breast: Neck:  no thyromegaly Abdomen: soft , no mass, normal active bowel sounds,  non-tender, no rebound tenderness Pelvic: deferred     Impression:    The encounter diagnosis was Encounter for other general counseling or advice on contraception.       Plan:    After thorough discussion the patient elects for L/S BTL  With falope rings . Failure rate of 1/300 discussed .  Preop appt will be scheduled .            Vilma Prader, MD

## 2021-01-01 ENCOUNTER — Encounter
Admission: RE | Admit: 2021-01-01 | Discharge: 2021-01-01 | Disposition: A | Discharge status: Home / Self Care | Payer: BC Managed Care – PPO | Source: Ambulatory Visit | Attending: Obstetrics and Gynecology | Admitting: Obstetrics and Gynecology

## 2021-01-01 ENCOUNTER — Other Ambulatory Visit: Payer: Self-pay

## 2021-01-01 HISTORY — DX: Anxiety disorder, unspecified: F41.9

## 2021-01-01 NOTE — Patient Instructions (Signed)
Your procedure is scheduled on: 01/11/21 Report to DAY SURGERY DEPARTMENT LOCATED ON 2ND FLOOR MEDICAL MALL ENTRANCE. To find out your arrival time please call [phone] between 1PM - 3PM on 01/10/21.  Remember: Instructions that are not followed completely may result in serious medical risk, up to and including death, or upon the discretion of your surgeon and anesthesiologist your surgery may need to be rescheduled.     _X__ 1. Do not eat food after midnight the night before your procedure.                 No gum chewing or hard candies. You may drink clear liquids up to 2 hours                 before you are scheduled to arrive for your surgery- DO not drink clear                 liquids within 2 hours of the start of your surgery.                 Clear Liquids include:  water, apple juice without pulp, clear carbohydrate                 drink such as Clearfast or Gatorade, Black Coffee or Tea (Do not add                 anything to coffee or tea). Diabetics water only  ENSURE "CLEAR" PRE SURGERY DRINK NEEDS TO BE FINISHED 2 HOURS PRIOR TO ARRIVAL DAY OF SURGERY  __X__2.  On the morning of surgery brush your teeth with toothpaste and water, you                 may rinse your mouth with mouthwash if you wish.  Do not swallow any              toothpaste of mouthwash.     _X__ 3.  No Alcohol for 24 hours before or after surgery.   _X__ 4.  Do Not Smoke or use e-cigarettes For 24 Hours Prior to Your Surgery.                 Do not use any chewable tobacco products for at least 6 hours prior to                 surgery.  ____  5.  Bring all medications with you on the day of surgery if instructed.   __X__  6.  Notify your doctor if there is any change in your medical condition      (cold, fever, infections).     Do not wear jewelry, make-up, hairpins, clips or nail polish. Do not wear lotions, powders, or perfumes.  Do not shave 48 hours prior to surgery. Men may shave face and  neck. Do not bring valuables to the hospital.    [facility] is not responsible for any belongings or valuables.  Contacts, dentures/partials or body piercings may not be worn into surgery. Bring a case for your contacts, glasses or hearing aids, a denture cup will be supplied. Leave your suitcase in the car. After surgery it may be brought to your room. For patients admitted to the hospital, discharge time is determined by your treatment team.   Patients discharged the day of surgery will not be allowed to drive home.   Please read over the following fact sheets that you were given:  CHG SOAP, ENSURE DRINK, INCENTIVE SPIROMETER  __X__ Take these medicines the morning of surgery with A SIP OF WATER:    1. atenolol (TENORMIN) 50 MG tablet  2.   3.   4.  5.  6.  ____ Fleet Enema (as directed)   __X__ Use CHG Soap/SAGE wipes as directed  ____ Use inhalers on the day of surgery  ____ Stop metformin/Janumet/Farxiga 2 days prior to surgery    ____ Take 1/2 of usual insulin dose the night before surgery. No insulin the morning          of surgery.   ____ Stop Blood Thinners Coumadin/Plavix/Xarelto/Pleta/Pradaxa/Eliquis/Effient/Aspirin  on   Or contact your Surgeon, Cardiologist or Medical Doctor regarding  ability to stop your blood thinners  __X__ Stop Anti-inflammatories 7 days before surgery such as Advil, Ibuprofen, Motrin,  BC or Goodies Powder, Naprosyn, Naproxen, Aleve, Aspirin    __X__ Stop all herbal supplements, fish oil or vitamin E until after surgery.    ____ Bring C-Pap to the hospital.

## 2021-01-05 ENCOUNTER — Other Ambulatory Visit: Payer: Self-pay

## 2021-01-05 ENCOUNTER — Other Ambulatory Visit
Admission: RE | Admit: 2021-01-05 | Discharge: 2021-01-05 | Disposition: A | Discharge status: Home / Self Care | Payer: BC Managed Care – PPO | Source: Ambulatory Visit | Attending: Obstetrics and Gynecology | Admitting: Obstetrics and Gynecology

## 2021-01-05 DIAGNOSIS — I10 Essential (primary) hypertension: Secondary | ICD-10-CM | POA: Diagnosis not present

## 2021-01-05 DIAGNOSIS — Z01818 Encounter for other preprocedural examination: Secondary | ICD-10-CM | POA: Diagnosis present

## 2021-01-05 LAB — BASIC METABOLIC PANEL
Anion gap: 5 (ref 5–15)
BUN: 7 mg/dL (ref 6–20)
CO2: 24 mmol/L (ref 22–32)
Calcium: 9.4 mg/dL (ref 8.9–10.3)
Chloride: 109 mmol/L (ref 98–111)
Creatinine, Ser: 0.62 mg/dL (ref 0.44–1.00)
GFR, Estimated: >60 mL/min (ref >60)
Glucose, Bld: 133 mg/dL — ABNORMAL HIGH (ref 70–99)
Potassium: 3.6 mmol/L (ref 3.5–5.1)
Sodium: 138 mmol/L (ref 135–145)

## 2021-01-05 LAB — TYPE AND SCREEN
ABO/RH(D): O POS
Antibody Screen: NEGATIVE

## 2021-01-05 LAB — CBC
HCT: 38.2 % (ref 36.0–46.0)
Hemoglobin: 13.2 g/dL (ref 12.0–15.0)
MCH: 29.7 pg (ref 26.0–34.0)
MCHC: 34.6 g/dL (ref 30.0–36.0)
MCV: 86 fL (ref 80.0–100.0)
Platelets: 226 10*3/uL (ref 150–400)
RBC: 4.44 MIL/uL (ref 3.87–5.11)
RDW: 12.9 % (ref 11.5–15.5)
WBC: 6.2 10*3/uL (ref 4.0–10.5)
nRBC: 0 % (ref 0.0–0.2)

## 2021-01-11 ENCOUNTER — Encounter: Admission: RE | Payer: Self-pay | Source: Home / Self Care

## 2021-01-11 ENCOUNTER — Ambulatory Visit
Admission: RE | Admit: 2021-01-11 | Payer: BC Managed Care – PPO | Source: Home / Self Care | Admitting: Obstetrics and Gynecology

## 2021-01-11 SURGERY — LIGATION, FALLOPIAN TUBE, LAPAROSCOPIC
Anesthesia: General | Laterality: Bilateral

## 2021-02-08 ENCOUNTER — Other Ambulatory Visit: Payer: Self-pay | Admitting: Obstetrics and Gynecology

## 2021-02-08 NOTE — H&P (Signed)
[name] is a 35 y.o. female here for L/S BTL . Pt is a G4 P3 svdx 3 and is intereted in discussing contraception . Tried OCPs and IUD in past . Not interested in using either again . 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  Past Surgical History:  has a past surgical history that includes WISDOM TEETH. Family History: family history includes High blood pressure (Hypertension) in her mother; No Known Problems in her brother and father. Social History:  reports that she has never smoked. She has never used smokeless tobacco. She reports that she does not drink alcohol and does not use drugs. OB/GYN History:          OB History     Gravida  4   Para  3   Term  3   Preterm      AB  1   Living  3      SAB  1   IAB      Ectopic      Molar      Multiple      Live Births  3             Allergies: is allergic to aspirin. Medications:   Current Outpatient Medications:    atenoloL (TENORMIN) 50 MG tablet, Take 50 mg by mouth once daily for Blood Pressure, Disp: , Rfl:    blood sugar diagnostic, disc (BLOOD GLUCOSE DIAGNOSTIC, DISC) test strip, Use once daily Use as instructed. (Patient not taking: Reported on 12/01/2020), Disp: 100 each, Rfl: 3   ferrous sulfate 325 (65 FE) MG tablet, Take 325 mg by mouth daily with breakfast (Patient not taking: No sig reported), Disp: , Rfl:    ibuprofen (MOTRIN) 600 MG tablet, Take 600 mg by mouth every 6 (six) hours as needed for Pain (Patient not taking: No sig reported), Disp: , Rfl:    labetaloL (TRANDATE) 200 MG tablet, Take 1 tablet (200 mg total) by mouth once daily (Patient not taking: Reported on 12/01/2020), Disp: 30 tablet, Rfl: 11   lancets, Use 1 each once daily Use as instructed. (Patient not taking: Reported on 12/01/2020), Disp: 100 each, Rfl: 3    norelgestromin-ethinyl estradiol (ORTHO EVRA) 150-35 mcg/24 hr patch, Place 1 patch onto the skin once a week (Patient not taking: Reported on 12/01/2020), Disp: 3 patch, Rfl: 11   prenatal vit-iron fum-folic ac (PRENAVITE) tablet, Take 1 tablet by mouth once daily (Patient not taking: No sig reported), Disp: , Rfl:    Review of Systems: General:                      No fatigue or weight loss Eyes:                           No vision changes Ears:                            No hearing difficulty Respiratory:                No cough or shortness of breath Pulmonary:                  No asthma or shortness of  breath Cardiovascular:           No chest pain, palpitations, dyspnea on exertion Gastrointestinal:          No abdominal bloating, chronic diarrhea, constipations, masses, pain or hematochezia Genitourinary:             No hematuria, dysuria, abnormal vaginal discharge, pelvic pain, Menometrorrhagia Lymphatic:                   No swollen lymph nodes Musculoskeletal:         No muscle weakness Neurologic:                  No extremity weakness, syncope, seizure disorder Psychiatric:                  No history of depression, delusions or suicidal/homicidal ideation      Exam:       Vitals:    02/09/21  BP: 128/84  Pulse: 65      Body mass index is 33.97 kg/m.   WDWN white/  female in NAD   Lungs: CTA  CV : RRR without murmur   Breast: Neck:  no thyromegaly Abdomen: soft , no mass, normal active bowel sounds,  non-tender, no rebound tenderness Pelvic: deferred     Impression:    The encounter diagnosis was Encounter for other general counseling or advice on contraception.       Plan:    After thorough discussion the patient elects for L/S BTL  With falope rings . Failure rate of 1/300 discussed .  Risk discussed with patient , please see [facility] notes           Sabas Sous Jaheem [place]

## 2021-02-16 ENCOUNTER — Encounter: Payer: Self-pay | Admitting: Urgent Care

## 2021-02-16 ENCOUNTER — Other Ambulatory Visit: Payer: Self-pay

## 2021-02-16 ENCOUNTER — Encounter
Admission: RE | Admit: 2021-02-16 | Discharge: 2021-02-16 | Disposition: A | Discharge status: Home / Self Care | Payer: BC Managed Care – PPO | Source: Ambulatory Visit | Attending: Obstetrics and Gynecology | Admitting: Obstetrics and Gynecology

## 2021-02-16 DIAGNOSIS — Z01812 Encounter for preprocedural laboratory examination: Secondary | ICD-10-CM | POA: Insufficient documentation

## 2021-02-16 LAB — CBC
HCT: 38 % (ref 36.0–46.0)
Hemoglobin: 12.6 g/dL (ref 12.0–15.0)
MCH: 29.9 pg (ref 26.0–34.0)
MCHC: 33.2 g/dL (ref 30.0–36.0)
MCV: 90 fL (ref 80.0–100.0)
Platelets: 277 10*3/uL (ref 150–400)
RBC: 4.22 MIL/uL (ref 3.87–5.11)
RDW: 12.4 % (ref 11.5–15.5)
WBC: 5.5 10*3/uL (ref 4.0–10.5)
nRBC: 0 % (ref 0.0–0.2)

## 2021-02-16 LAB — BASIC METABOLIC PANEL
Anion gap: 10 (ref 5–15)
BUN: 11 mg/dL (ref 6–20)
CO2: 23 mmol/L (ref 22–32)
Calcium: 8.8 mg/dL — ABNORMAL LOW (ref 8.9–10.3)
Chloride: 105 mmol/L (ref 98–111)
Creatinine, Ser: 0.61 mg/dL (ref 0.44–1.00)
GFR, Estimated: >60 mL/min (ref >60)
Glucose, Bld: 124 mg/dL — ABNORMAL HIGH (ref 70–99)
Potassium: 3.8 mmol/L (ref 3.5–5.1)
Sodium: 138 mmol/L (ref 135–145)

## 2021-02-16 LAB — TYPE AND SCREEN
ABO/RH(D): O POS
Antibody Screen: NEGATIVE

## 2021-02-22 ENCOUNTER — Other Ambulatory Visit: Payer: Self-pay

## 2021-02-22 ENCOUNTER — Encounter: Payer: Self-pay | Admitting: Obstetrics and Gynecology

## 2021-02-22 ENCOUNTER — Encounter
Admission: RE | Disposition: A | Discharge status: Home / Self Care | Payer: Self-pay | Source: Home / Self Care | Attending: Obstetrics and Gynecology

## 2021-02-22 ENCOUNTER — Ambulatory Visit: Payer: BC Managed Care – PPO | Admitting: Anesthesiology

## 2021-02-22 ENCOUNTER — Ambulatory Visit
Admission: RE | Admit: 2021-02-22 | Discharge: 2021-02-22 | Disposition: A | Discharge status: Home / Self Care | Payer: BC Managed Care – PPO | Attending: Obstetrics and Gynecology | Admitting: Obstetrics and Gynecology

## 2021-02-22 DIAGNOSIS — Z886 Allergy status to analgesic agent status: Secondary | ICD-10-CM | POA: Insufficient documentation

## 2021-02-22 DIAGNOSIS — Z79899 Other long term (current) drug therapy: Secondary | ICD-10-CM | POA: Diagnosis not present

## 2021-02-22 DIAGNOSIS — Z302 Encounter for sterilization: Secondary | ICD-10-CM | POA: Diagnosis present

## 2021-02-22 HISTORY — PX: LAPAROSCOPIC TUBAL LIGATION: SHX1937

## 2021-02-22 LAB — POCT PREGNANCY, URINE: Preg Test, Ur: NEGATIVE

## 2021-02-22 SURGERY — LIGATION, FALLOPIAN TUBE, LAPAROSCOPIC
Anesthesia: General | Laterality: Bilateral

## 2021-02-22 MED ORDER — ORAL CARE MOUTH RINSE: 15.0000 mL | Freq: Once | OROMUCOSAL | Status: AC

## 2021-02-22 MED ORDER — FAMOTIDINE 20 MG PO TABS
ORAL_TABLET | ORAL | Status: AC
  Administered 2021-02-22: 20 mg via ORAL
  Filled 2021-02-22: qty 1

## 2021-02-22 MED ORDER — GABAPENTIN 300 MG PO CAPS
ORAL_CAPSULE | ORAL | Status: AC
  Administered 2021-02-22: 300 mg via ORAL
  Filled 2021-02-22: qty 1

## 2021-02-22 MED ORDER — BUPIVACAINE HCL (PF) 0.5 % IJ SOLN
INTRAMUSCULAR | Status: AC
  Filled 2021-02-22: qty 30

## 2021-02-22 MED ORDER — OXYCODONE HCL 5 MG PO TABS
ORAL_TABLET | ORAL | Status: AC
  Filled 2021-02-22: qty 1

## 2021-02-22 MED ORDER — ROCURONIUM BROMIDE 100 MG/10ML IV SOLN
INTRAVENOUS | Status: DC | PRN
  Administered 2021-02-22: 50 mg via INTRAVENOUS

## 2021-02-22 MED ORDER — LACTATED RINGERS IV SOLN: INTRAVENOUS | Status: DC

## 2021-02-22 MED ORDER — GABAPENTIN 300 MG PO CAPS: 300.0000 mg | ORAL_CAPSULE | ORAL | Status: AC

## 2021-02-22 MED ORDER — CHLORHEXIDINE GLUCONATE 0.12 % MT SOLN
OROMUCOSAL | Status: AC
  Administered 2021-02-22: 15 mL via OROMUCOSAL
  Filled 2021-02-22: qty 15

## 2021-02-22 MED ORDER — ONDANSETRON 4 MG PO TBDP
4.0000 mg | ORAL_TABLET | Freq: Four times a day (QID) | ORAL | Status: DC | PRN
Start: 2021-02-22 — End: 2021-02-22

## 2021-02-22 MED ORDER — BUPIVACAINE HCL 0.5 % IJ SOLN
INTRAMUSCULAR | Status: DC | PRN
  Administered 2021-02-22: 6 mL

## 2021-02-22 MED ORDER — KETOROLAC TROMETHAMINE 30 MG/ML IJ SOLN
INTRAMUSCULAR | Status: AC
  Filled 2021-02-22: qty 1

## 2021-02-22 MED ORDER — FENTANYL CITRATE (PF) 100 MCG/2ML IJ SOLN
INTRAMUSCULAR | Status: DC | PRN
  Administered 2021-02-22 (×2): 50 ug via INTRAVENOUS

## 2021-02-22 MED ORDER — CHLORHEXIDINE GLUCONATE 0.12 % MT SOLN: 15.0000 mL | Freq: Once | OROMUCOSAL | Status: AC

## 2021-02-22 MED ORDER — DEXAMETHASONE SODIUM PHOSPHATE 10 MG/ML IJ SOLN
INTRAMUSCULAR | Status: AC
  Filled 2021-02-22: qty 1

## 2021-02-22 MED ORDER — KETOROLAC TROMETHAMINE 30 MG/ML IJ SOLN
INTRAMUSCULAR | Status: DC | PRN
  Administered 2021-02-22: 30 mg via INTRAVENOUS

## 2021-02-22 MED ORDER — POVIDONE-IODINE 10 % EX SWAB
2.0000 "application " | Freq: Once | CUTANEOUS | Status: AC
  Administered 2021-02-22: 2 via TOPICAL

## 2021-02-22 MED ORDER — DEXMEDETOMIDINE (PRECEDEX) IN NS 20 MCG/5ML (4 MCG/ML) IV SYRINGE
PREFILLED_SYRINGE | INTRAVENOUS | Status: DC | PRN
  Administered 2021-02-22: 12 ug via INTRAVENOUS
  Administered 2021-02-22: 8 ug via INTRAVENOUS

## 2021-02-22 MED ORDER — DEXAMETHASONE SODIUM PHOSPHATE 10 MG/ML IJ SOLN
INTRAMUSCULAR | Status: DC | PRN
  Administered 2021-02-22: 7 mg via INTRAVENOUS

## 2021-02-22 MED ORDER — OXYCODONE-ACETAMINOPHEN 5-325 MG PO TABS
1.0000 | ORAL_TABLET | ORAL | Status: DC | PRN
Start: 2021-02-22 — End: 2021-02-22

## 2021-02-22 MED ORDER — ONDANSETRON HCL 4 MG/2ML IJ SOLN
INTRAMUSCULAR | Status: AC
  Filled 2021-02-22: qty 2

## 2021-02-22 MED ORDER — SUGAMMADEX SODIUM 200 MG/2ML IV SOLN
INTRAVENOUS | Status: DC | PRN
Start: 2021-02-22 — End: 2021-02-22
  Administered 2021-02-22: 200 mg via INTRAVENOUS

## 2021-02-22 MED ORDER — PROMETHAZINE HCL 25 MG/ML IJ SOLN: 6.2500 mg | INTRAMUSCULAR | Status: DC | PRN

## 2021-02-22 MED ORDER — FENTANYL CITRATE (PF) 100 MCG/2ML IJ SOLN: 25.0000 ug | INTRAMUSCULAR | Status: DC | PRN

## 2021-02-22 MED ORDER — ACETAMINOPHEN 500 MG PO TABS: 1000.0000 mg | ORAL_TABLET | ORAL | Status: AC

## 2021-02-22 MED ORDER — FENTANYL CITRATE (PF) 100 MCG/2ML IJ SOLN
INTRAMUSCULAR | Status: AC
  Filled 2021-02-22: qty 2

## 2021-02-22 MED ORDER — LIDOCAINE HCL (CARDIAC) PF 100 MG/5ML IV SOSY
PREFILLED_SYRINGE | INTRAVENOUS | Status: DC | PRN
  Administered 2021-02-22: 60 mg via INTRAVENOUS

## 2021-02-22 MED ORDER — MEPERIDINE HCL 25 MG/ML IJ SOLN: 6.2500 mg | INTRAMUSCULAR | Status: DC | PRN

## 2021-02-22 MED ORDER — OXYCODONE HCL 5 MG PO TABS
5.0000 mg | ORAL_TABLET | Freq: Once | ORAL | Status: AC | PRN
  Administered 2021-02-22: 5 mg via ORAL

## 2021-02-22 MED ORDER — MIDAZOLAM HCL 2 MG/2ML IJ SOLN
INTRAMUSCULAR | Status: DC | PRN
  Administered 2021-02-22: 2 mg via INTRAVENOUS

## 2021-02-22 MED ORDER — PROPOFOL 10 MG/ML IV BOLUS
INTRAVENOUS | Status: AC
  Filled 2021-02-22: qty 20

## 2021-02-22 MED ORDER — ONDANSETRON HCL 4 MG/2ML IJ SOLN
INTRAMUSCULAR | Status: DC | PRN
Start: 2021-02-22 — End: 2021-02-22
  Administered 2021-02-22: 4 mg via INTRAVENOUS

## 2021-02-22 MED ORDER — MIDAZOLAM HCL 2 MG/2ML IJ SOLN
INTRAMUSCULAR | Status: AC
  Filled 2021-02-22: qty 2

## 2021-02-22 MED ORDER — OXYCODONE HCL 5 MG/5ML PO SOLN
5.0000 mg | Freq: Once | ORAL | Status: AC | PRN
Start: 2021-02-22 — End: 2021-02-22

## 2021-02-22 MED ORDER — DEXMEDETOMIDINE (PRECEDEX) IN NS 20 MCG/5ML (4 MCG/ML) IV SYRINGE
PREFILLED_SYRINGE | INTRAVENOUS | Status: AC
  Filled 2021-02-22: qty 5

## 2021-02-22 MED ORDER — ACETAMINOPHEN 500 MG PO TABS
ORAL_TABLET | ORAL | Status: AC
  Administered 2021-02-22: 1000 mg via ORAL
  Filled 2021-02-22: qty 1

## 2021-02-22 MED ORDER — FAMOTIDINE 20 MG PO TABS: 20.0000 mg | ORAL_TABLET | Freq: Once | ORAL | Status: AC

## 2021-02-22 MED ORDER — PROPOFOL 10 MG/ML IV BOLUS
INTRAVENOUS | Status: DC | PRN
  Administered 2021-02-22: 200 mg via INTRAVENOUS

## 2021-02-22 SURGICAL SUPPLY — 39 items
APL PRP STRL LF DISP 70% ISPRP (MISCELLANEOUS) ×1
APR BAND 32X8 2 INCS BAND (Ring) ×1 IMPLANT
BLADE SURG SZ11 CARB STEEL (BLADE) ×2 IMPLANT
CATH ROBINSON RED A/P 16FR (CATHETERS) ×2 IMPLANT
CHLORAPREP W/TINT 26 (MISCELLANEOUS) ×2 IMPLANT
DRSG TEGADERM 2-3/8X2-3/4 SM (GAUZE/BANDAGES/DRESSINGS) ×4 IMPLANT
GAUZE 4X4 16PLY ~~LOC~~+RFID DBL (SPONGE) ×2 IMPLANT
GLOVE SURG SYN 8.0 (GLOVE) ×2 IMPLANT
GOWN STRL REUS W/ TWL LRG LVL3 (GOWN DISPOSABLE) ×1 IMPLANT
GOWN STRL REUS W/ TWL XL LVL3 (GOWN DISPOSABLE) ×1 IMPLANT
GOWN STRL REUS W/TWL LRG LVL3 (GOWN DISPOSABLE) ×2
GOWN STRL REUS W/TWL XL LVL3 (GOWN DISPOSABLE) ×2
GRASPER SUT TROCAR 14GX15 (MISCELLANEOUS) IMPLANT
KIT DISPOSABLE FALLOPE RING (Ring) ×2 IMPLANT
KIT PINK PAD W/HEAD ARE REST (MISCELLANEOUS) ×2
KIT PINK PAD W/HEAD ARM REST (MISCELLANEOUS) ×1 IMPLANT
KIT TURNOVER CYSTO (KITS) ×2 IMPLANT
LABEL OR SOLS (LABEL) ×2 IMPLANT
MANIFOLD NEPTUNE II (INSTRUMENTS) ×2 IMPLANT
NDL HPO THNWL 1X22GA REG BVL (NEEDLE) ×1 IMPLANT
NEEDLE SAFETY 22GX1 (NEEDLE) ×2
NS IRRIG 500ML POUR BTL (IV SOLUTION) ×2 IMPLANT
PACK GYN LAPAROSCOPIC (MISCELLANEOUS) ×2 IMPLANT
PAD OB MATERNITY 4.3X12.25 (PERSONAL CARE ITEMS) ×2 IMPLANT
PAD PREP 24X41 OB/GYN DISP (PERSONAL CARE ITEMS) ×2 IMPLANT
SCRUB EXIDINE 4% CHG 4OZ (MISCELLANEOUS) ×2 IMPLANT
SET TUBE SMOKE EVAC HIGH FLOW (TUBING) ×2 IMPLANT
SHEARS HARMONIC ACE PLUS 36CM (ENDOMECHANICALS) IMPLANT
SLEEVE ENDOPATH XCEL 5M (ENDOMECHANICALS) IMPLANT
SPONGE GAUZE 2X2 8PLY STRL LF (GAUZE/BANDAGES/DRESSINGS) ×2 IMPLANT
STRIP CLOSURE SKIN 1/4X4 (GAUZE/BANDAGES/DRESSINGS) ×2 IMPLANT
SUT VIC AB 0 CT1 36 (SUTURE) IMPLANT
SUT VIC AB 2-0 UR6 27 (SUTURE) IMPLANT
SUT VIC AB 4-0 SH 27 (SUTURE) ×2
SUT VIC AB 4-0 SH 27XANBCTRL (SUTURE) ×1 IMPLANT
SWABSTK COMLB BENZOIN TINCTURE (MISCELLANEOUS) IMPLANT
TROCAR ENDO BLADELESS 11MM (ENDOMECHANICALS) IMPLANT
TROCAR XCEL NON-BLD 5MMX100MML (ENDOMECHANICALS) ×2 IMPLANT
WATER STERILE IRR 500ML POUR (IV SOLUTION) ×2 IMPLANT

## 2021-02-22 NOTE — Progress Notes (Signed)
Ready for BTL . Labs reviewed . All questions answered .

## 2021-02-22 NOTE — Transfer of Care (Signed)
Immediate Anesthesia Transfer of Care Note  Patient: Lindsey Gutierrez  Procedure(s) Performed: LAPAROSCOPIC TUBAL LIGATION WITH FALOPE RINGS (Bilateral)  Patient Location: PACU  Anesthesia Type:General  Level of Consciousness: drowsy and patient cooperative  Airway & Oxygen Therapy: Patient Spontanous Breathing  Post-op Assessment: Report given to RN and Post -op Vital signs reviewed and stable  Post vital signs: Reviewed and stable  Last Vitals:  Vitals Value Taken Time  BP 154/84 02/22/21 1342  Temp 36.2 C 02/22/21 1342  Pulse 64 02/22/21 1345  Resp 20 02/22/21 1345  SpO2 92 % 02/22/21 1345  Vitals shown include unvalidated device data.  Last Pain:  Vitals:   02/22/21 1342  TempSrc:   PainSc: 0-No pain         Complications: No notable events documented.

## 2021-02-22 NOTE — Anesthesia Preprocedure Evaluation (Signed)
Anesthesia Evaluation  Patient identified by MRN, date of birth, ID band Patient awake    Reviewed: Allergy & Precautions, NPO status , Patient's Chart, lab work & pertinent test results  History of Anesthesia Complications Negative for: history of anesthetic complications  Airway Mallampati: II  TM Distance: >3 FB Neck ROM: Full    Dental no notable dental hx.    Pulmonary neg pulmonary ROS, neg sleep apnea, neg COPD,    breath sounds clear to auscultation- rhonchi (-) wheezing      Cardiovascular hypertension, Pt. on medications (-) CAD, (-) Past MI, (-) Cardiac Stents and (-) CABG  Rhythm:Regular Rate:Normal - Systolic murmurs and - Diastolic murmurs    Neuro/Psych neg Seizures Anxiety negative neurological ROS     GI/Hepatic negative GI ROS, Neg liver ROS,   Endo/Other  neg diabetes  Renal/GU negative Renal ROS     Musculoskeletal negative musculoskeletal ROS (+)   Abdominal (+) + obese,   Peds  Hematology negative hematology ROS (+)   Anesthesia Other Findings Past Medical History: No date: Anxiety No date: Gestational diabetes No date: Hypertension No date: Obesity (BMI 30.0-34.9) No date: Pyelonephritis   Reproductive/Obstetrics                             Anesthesia Physical Anesthesia Plan  ASA: 2  Anesthesia Plan: General   Post-op Pain Management:    Induction: Intravenous  PONV Risk Score and Plan: 2 and Ondansetron, Dexamethasone and Midazolam  Airway Management Planned: Oral ETT  Additional Equipment:   Intra-op Plan:   Post-operative Plan: Extubation in OR  Informed Consent: I have reviewed the patients History and Physical, chart, labs and discussed the procedure including the risks, benefits and alternatives for the proposed anesthesia with the patient or authorized representative who has indicated his/her understanding and acceptance.     Dental  advisory given  Plan Discussed with: CRNA and Anesthesiologist  Anesthesia Plan Comments:         Anesthesia Quick Evaluation

## 2021-02-22 NOTE — Brief Op Note (Signed)
02/22/2021  1:26 PM  PATIENT:  Lindsey Gutierrez  35 y.o. female  PRE-OPERATIVE DIAGNOSIS:  elective sterilization  POST-OPERATIVE DIAGNOSIS:  elective sterilization  PROCEDURE:  Procedure(s): LAPAROSCOPIC TUBAL LIGATION WITH FALOPE RINGS (Bilateral)  SURGEON:  Surgeon(s) and Role:    * Seraphim Affinito, Ihor Austin, MD - Primary  PHYSICIAN ASSISTANT: cst  ASSISTANTS: none   ANESTHESIA:   general  EBL:  5 mL   BLOOD ADMINISTERED:none  DRAINS: none   LOCAL MEDICATIONS USED:  MARCAINE     SPECIMEN:  No Specimen  DISPOSITION OF SPECIMEN:  N/A  COUNTS:  YES  TOURNIQUET:  * No tourniquets in log *  DICTATION: .Other Dictation: Dictation Number verbal  PLAN OF CARE: Discharge to home after PACU  PATIENT DISPOSITION:  PACU - hemodynamically stable.   Delay start of Pharmacological VTE agent (>24hrs) due to surgical blood loss or risk of bleeding: not applicable

## 2021-02-22 NOTE — Anesthesia Procedure Notes (Signed)
Procedure Name: Intubation Date/Time: 02/22/2021 12:51 PM Performed by: Jonna Clark, CRNA Pre-anesthesia Checklist: Patient identified, Patient being monitored, Timeout performed, Emergency Drugs available and Suction available Patient Re-evaluated:Patient Re-evaluated prior to induction Oxygen Delivery Method: Circle system utilized Preoxygenation: Pre-oxygenation with 100% oxygen Induction Type: IV induction Ventilation: Mask ventilation without difficulty Laryngoscope Size: Mac and 3 Grade View: Grade I Tube type: Oral Tube size: 7.0 mm Number of attempts: 1 Airway Equipment and Method: Stylet Placement Confirmation: ETT inserted through vocal cords under direct vision, positive ETCO2 and breath sounds checked- equal and bilateral Secured at: 21 cm Tube secured with: Tape Dental Injury: Teeth and Oropharynx as per pre-operative assessment

## 2021-02-22 NOTE — Discharge Instructions (Signed)

## 2021-02-22 NOTE — Op Note (Signed)
NAME: Lindsey Gutierrez, Lindsey Gutierrez MEDICAL RECORD NO: [id number] ACCOUNT NO: [id number] DATE OF BIRTH: 1986/03/25 FACILITY: [facility] LOCATION: [facility] PHYSICIAN: Suzy Bouchard, MD  Operative Report   DATE OF PROCEDURE: 02/22/2021  PREOPERATIVE DIAGNOSIS:  Elective permanent sterilization.  POSTOPERATIVE DIAGNOSIS:  Elective permanent sterilization.  PROCEDURE:  Laparoscopic bilateral tubal ligation with Falope rings.  ANESTHESIA:  General endotracheal anesthesia.  SURGEON:  Suzy Bouchard, MD  FIRST ASSISTANT:  Certified scrub tech.  INDICATIONS:  A 35 year old, desires permanent sterilization.  DESCRIPTION OF PROCEDURE:  After adequate general endotracheal anesthesia, the patient was placed in dorsal supine position with the legs in the Ophir stirrups.  The patient's abdomen, perineum, and vagina were prepped and draped in normal sterile  fashion.  Timeout was performed.  A sponge stick was placed in the vagina to be used for uterine manipulation during the procedure.  Straight catheterization of the bladder yielded 200 mL clear urine.  Gloves and gown were changed.  Attention was  directed to the patient's abdomen where a 5 mm infraumbilical incision was made after injection with 0.5% Marcaine.  A 5 mm laparoscope was advanced into the abdominal cavity under direct visualization with the Optiview cannula.  The patient's abdomen  was insufflated.  The patient was placed in Trendelenburg, and a second port was placed with the Falope ring trocar 2 cm above the symphysis pubis after injecting with 0.5% Marcaine.  Trocar was advanced under direct visualization.  The Falope ring was  then applied to the right isthmic ampullary portion of the fallopian tube with a resulting 2 cm knuckle of fallopian tube.  Pictures were taken.  Similar procedure was repeated on the patient's left fallopian tube after visualizing the fimbriated end on  that side as well, and again the Falope ring was  applied at the isthmic ampullary portion of the left tube  with a resulting 2 cm knuckle of fallopian tube.  Pelvis visualized normal as well as the upper abdomen. The patient's abdomen was then  deflated, and all instruments were removed and each port site was closed at the skin level with a 4-0 Vicryl suture.  Sterile dressing was applied.  Sponge stick was removed.  There were no complications.  ESTIMATED BLOOD LOSS:  Minimal.  INTRAOPERATIVE FLUIDS:  500 mL.  URINE OUTPUT:  200 mL.   ROH D: 02/22/2021 1:58:38 pm T: 02/22/2021 3:24:00 pm  JOB: [id number]/ [id number]

## 2021-02-23 ENCOUNTER — Encounter: Payer: Self-pay | Admitting: Obstetrics and Gynecology

## 2021-02-23 NOTE — Anesthesia Postprocedure Evaluation (Signed)
Anesthesia Post Note  Patient: SHAHIRA FISKE  Procedure(s) Performed: LAPAROSCOPIC TUBAL LIGATION WITH FALOPE RINGS (Bilateral)  Patient location during evaluation: PACU Anesthesia Type: General Level of consciousness: awake and alert and oriented Pain management: pain level controlled Vital Signs Assessment: post-procedure vital signs reviewed and stable Respiratory status: spontaneous breathing, nonlabored ventilation and respiratory function stable Cardiovascular status: blood pressure returned to baseline and stable Postop Assessment: no signs of nausea or vomiting Anesthetic complications: no   No notable events documented.   Last Vitals:  Vitals:   02/22/21 1418 02/22/21 1715  BP: 129/76 130/77  Pulse: 65 72  Resp: 15 17  Temp: (!) 36.2 C (!) 36.4 C  SpO2: 97% 98%    Last Pain:  Vitals:   02/22/21 1715  TempSrc:   PainSc: 2                  Kalii Chesmore
